# Patient Record
Sex: Female | Born: 1935 | Race: White | Hispanic: No | Marital: Married | Smoking: Former smoker
Health system: Southern US, Community
[De-identification: ages and names within clinical notes are randomized; demographics above are authoritative.]

## PROBLEM LIST (undated history)

## (undated) DIAGNOSIS — C189 Malignant neoplasm of colon, unspecified: Secondary | ICD-10-CM

## (undated) DIAGNOSIS — G500 Trigeminal neuralgia: Secondary | ICD-10-CM

## (undated) DIAGNOSIS — C801 Malignant (primary) neoplasm, unspecified: Secondary | ICD-10-CM

## (undated) DIAGNOSIS — I509 Heart failure, unspecified: Secondary | ICD-10-CM

## (undated) DIAGNOSIS — H8110 Benign paroxysmal vertigo, unspecified ear: Principal | ICD-10-CM

## (undated) DIAGNOSIS — C649 Malignant neoplasm of unspecified kidney, except renal pelvis: Secondary | ICD-10-CM

## (undated) DIAGNOSIS — M1990 Unspecified osteoarthritis, unspecified site: Secondary | ICD-10-CM

## (undated) DIAGNOSIS — E538 Deficiency of other specified B group vitamins: Secondary | ICD-10-CM

## (undated) DIAGNOSIS — F32A Depression, unspecified: Secondary | ICD-10-CM

## (undated) DIAGNOSIS — F419 Anxiety disorder, unspecified: Secondary | ICD-10-CM

## (undated) DIAGNOSIS — M810 Age-related osteoporosis without current pathological fracture: Secondary | ICD-10-CM

## (undated) DIAGNOSIS — J449 Chronic obstructive pulmonary disease, unspecified: Secondary | ICD-10-CM

## (undated) DIAGNOSIS — E785 Hyperlipidemia, unspecified: Secondary | ICD-10-CM

## (undated) DIAGNOSIS — F329 Major depressive disorder, single episode, unspecified: Secondary | ICD-10-CM

## (undated) HISTORY — DX: Benign paroxysmal vertigo, unspecified ear: H81.10

## (undated) HISTORY — DX: Heart failure, unspecified: I50.9

## (undated) HISTORY — DX: Trigeminal neuralgia: G50.0

## (undated) HISTORY — DX: Malignant neoplasm of unspecified kidney, except renal pelvis: C64.9

## (undated) HISTORY — DX: Anxiety disorder, unspecified: F41.9

## (undated) HISTORY — DX: Major depressive disorder, single episode, unspecified: F32.9

## (undated) HISTORY — DX: Depression, unspecified: F32.A

## (undated) HISTORY — DX: Chronic obstructive pulmonary disease, unspecified: J44.9

## (undated) HISTORY — DX: Unspecified osteoarthritis, unspecified site: M19.90

## (undated) HISTORY — DX: Malignant neoplasm of colon, unspecified: C18.9

---

## 1976-03-26 HISTORY — PX: CYST EXCISION: SHX5701

## 2002-03-26 HISTORY — PX: CHOLECYSTECTOMY: SHX55

## 2002-03-26 HISTORY — PX: HEMICOLECTOMY: SHX854

## 2003-03-27 HISTORY — PX: KIDNEY SURGERY: SHX687

## 2007-12-12 ENCOUNTER — Ambulatory Visit: Payer: Self-pay | Admitting: Cardiology

## 2011-08-10 ENCOUNTER — Encounter: Payer: Self-pay | Admitting: Hematology and Oncology

## 2011-08-10 DIAGNOSIS — C50919 Malignant neoplasm of unspecified site of unspecified female breast: Secondary | ICD-10-CM

## 2011-08-10 DIAGNOSIS — C649 Malignant neoplasm of unspecified kidney, except renal pelvis: Secondary | ICD-10-CM

## 2011-08-21 ENCOUNTER — Encounter: Payer: Medicare Other | Admitting: Hematology and Oncology

## 2011-08-24 DIAGNOSIS — R0602 Shortness of breath: Secondary | ICD-10-CM

## 2012-10-07 ENCOUNTER — Encounter: Payer: Self-pay | Admitting: Neurology

## 2012-10-07 ENCOUNTER — Ambulatory Visit (INDEPENDENT_AMBULATORY_CARE_PROVIDER_SITE_OTHER): Payer: Medicare Other | Admitting: Neurology

## 2012-10-07 VITALS — BP 136/73 | HR 91 | Temp 98.2°F | Ht 65.25 in | Wt 164.0 lb

## 2012-10-07 DIAGNOSIS — H8110 Benign paroxysmal vertigo, unspecified ear: Secondary | ICD-10-CM

## 2012-10-07 HISTORY — DX: Benign paroxysmal vertigo, unspecified ear: H81.10

## 2012-10-07 NOTE — Progress Notes (Signed)
Subjective:    Patient ID: Sharon Rogers is a 79 y.o. female.  HPI  Huston Foley, MD, PhD [facility] [address], [address] [address] [place], [place] [address]   Dear Tresa Endo,   I saw your patient, Sharon Rogers, upon your kind request in my neurologic clinic today for initial consultation of her vertigo. The patient is accompanied by her daughter today. As you know, [name] is a very pleasant 79 year old right-handed woman with an underlying medical history of colon cancer, kidney cancer, COPD, vitamin B12 deficiency, osteoporosis, hyperlipidemia, psoriatic arthritis, anxiety and depression, who has been feeling off balance off and on for years. She feels dizzy and has had some falls per daughter. Symptoms have become worse in the last 6-7 months. She has been on Tegretol for trigeminal neuralgia but has stopped taking it in the morning because of drowsiness. She feels that the room keep spinning when she is moving. This is worse with certain head positions. She is status post right hemicolectomy in 2004, cholecystectomy in 2004, left kidney removal in 2005 and has a remote history of back surgery in 1969. Her current medications are nortriptyline, Flonase, Proair, Spiriva, folic acid, carbamazepine, methotrexate, Prolia, visual, Zoloft, vitamin D, Singulair.  At the time of her visit with you on 08/26/2012 she was offered a referral to physical therapy but declined at the time. She sees Dr. Laurian Brim for pain management, in particular her TGN. She may be able to come off the Pamelor.   Her Past Medical History Is Significant For: Past Medical History  Diagnosis Date  . Benign paroxysmal positional vertigo 10/07/2012  . Colon cancer   . Kidney cancer, primary, with metastasis from kidney to other site   . Arthritis   . Trigeminal neuralgia   . Depression   . Anxiety     Her Past Surgical History Is Significant For: Past Surgical History  Procedure  Laterality Date  . Hemicolectomy Right 2004  . Cholecystectomy  2004  . Kidney surgery Left 2005    removed  . Cesarean section  [phone]  . Cyst excision  1978    Base of spine    Her Family History Is Significant For: Family History  Problem Relation Age of Onset  . Cancer Sister     Breast  . Cancer Sister     Breast  . Cancer Sister     Breast    Her Social History Is Significant For: History   Social History  . Marital Status: Married    Spouse Name: N/A    Number of Children: N/A  . Years of Education: N/A   Social History Main Topics  . Smoking status: Former Smoker    Types: Cigarettes    Quit date: 10/08/1979  . Smokeless tobacco: None  . Alcohol Use: None  . Drug Use: No  . Sexually Active: None   Other Topics Concern  . None   Social History Narrative  . None    Her Allergies Are:  Allergies  Allergen Reactions  . Penicillins Itching  . Phenergan (Promethazine Hcl) Other (See Comments)    Psychological   :   Her Current Medications Are:  Outpatient Encounter Prescriptions as of 10/07/2012  Medication Sig Dispense Refill  . carbamazepine (TEGRETOL) 200 MG tablet Take 2 tablets by mouth daily.      . ergocalciferol (VITAMIN D2) 50000 UNITS capsule Take 50,000 Units by mouth once a week.      . fish  oil-omega-3 fatty acids 1000 MG capsule Take 1,290 mg by mouth daily.      . Fluticasone-Salmeterol (ADVAIR) 250-50 MCG/DOSE AEPB Inhale 1 puff into the lungs every 12 (twelve) hours.      . folic acid (FOLVITE) 1 MG tablet Take 1 mg by mouth daily.      . methotrexate (RHEUMATREX) 2.5 MG tablet Take 5 tablets by mouth once a week.      . montelukast (SINGULAIR) 10 MG tablet Take 10 mg by mouth daily.      . nortriptyline (PAMELOR) 50 MG capsule Take 50 mg by mouth daily.      . sertraline (ZOLOFT) 100 MG tablet Take 100 mg by mouth daily.      . vitamin B-12 (CYANOCOBALAMIN) 1000 MCG tablet Take 1,000 mcg by mouth as needed.       No  facility-administered encounter medications on file as of 10/07/2012.  : Review of Systems  Constitutional: Positive for fatigue and unexpected weight change.  HENT: Positive for hearing loss, rhinorrhea and tinnitus.   Eyes: Positive for visual disturbance (blurred).  Respiratory:       Snoring  Cardiovascular: Positive for leg swelling.  Musculoskeletal: Positive for joint swelling and arthralgias.  Neurological: Positive for dizziness and weakness.       Memory list  Psychiatric/Behavioral: Positive for dysphoric mood. The patient is nervous/anxious.        Slepiness    Objective:  Neurologic Exam  Physical Exam Physical Examination:   Filed Vitals:   10/07/12 1344  BP: 136/73  Pulse: 91  Temp: 98.2 F (36.8 C)    General Examination: The patient is a very pleasant 79 y.o. female in no acute distress. She appears well-developed and well-nourished and well groomed.   HEENT: Normocephalic, atraumatic, pupils are equal, round and reactive to light and accommodation. Funduscopic exam is normal with sharp disc margins noted. Extraocular tracking is good without limitation to gaze excursion or nystagmus noted. Normal smooth pursuit is noted. Hearing is grossly intact. Tympanic membranes are clear bilaterally. Face is symmetric with normal facial animation and normal facial sensation. Speech is clear with no dysarthria noted. There is no hypophonia. There is no lip, neck/head, jaw or voice tremor. Neck is supple with full range of passive and active motion. There are no carotid bruits on auscultation. Oropharynx exam reveals: mild mouth dryness, adequate dental hygiene and mild airway crowding, due to narrow airway. Mallampati is class II. Tongue protrudes centrally and palate elevates symmetrically.   Chest: Clear to auscultation without wheezing, rhonchi or crackles noted.  Heart: S1+S2+0, regular and normal without murmurs, rubs or gallops noted.   Abdomen: Soft, non-tender and  non-distended with normal bowel sounds appreciated on auscultation.  Extremities: There is no pitting edema in the distal lower extremities bilaterally. Pedal pulses are intact.  Skin: Warm and dry without trophic changes noted. There are no varicose veins.  Musculoskeletal: exam reveals no obvious joint deformities, tenderness or joint swelling or erythema.   Neurologically:  Mental status: The patient is awake, alert and oriented in all 4 spheres. Her memory, attention, language and knowledge are appropriate. There is no aphasia, agnosia, apraxia or anomia. Speech is clear with normal prosody and enunciation. Thought process is linear. Mood is congruent and affect is normal.  Cranial nerves are as described above under HEENT exam. In addition, shoulder shrug is normal with equal shoulder height noted. Motor exam: Normal bulk, strength and tone is noted. There is no drift, tremor or  rebound. Romberg is negative. Reflexes are 2+ throughout. Toes are downgoing bilaterally. Fine motor skills are intact with normal finger taps, normal hand movements, normal rapid alternating patting, normal foot taps and normal foot agility.  Cerebellar testing shows no dysmetria or intention tremor on finger to nose testing. Heel to shin is unremarkable bilaterally. There is no truncal or gait ataxia.  Sensory exam is intact to light touch, pinprick, vibration, temperature sense and proprioception in the upper and lower extremities.  Gait, station and balance are unremarkable. No veering to one side is noted. No leaning to one side is noted. Posture is age-appropriate and stance is narrow based. No problems turning are noted. She turns in 3 steps. Tandem walk is unremarkable. Intact toe and heel stance is noted.               Assessment and Plan:   In summary, Alese Furniss is a very pleasant 79 y.o.-year old female with a history of colon cancer, kidney cancer, COPD, vitamin B12 deficiency, osteoporosis,  hyperlipidemia, psoriatic arthritis, anxiety and depression, who presents with a Hx of intermittent vertigo with falls reported. Her history and physical exam are most consistent with BPPV. Otherwise, her physical exam is non-focal and I reassured the patient in that regard.  I had a long chat with the patient and her daughter about my findings and the diagnosis of positional vertigo, the prognosis and treatment options. We talked about medical treatments and non-pharmacological approaches. We talked about staying well hydrated and changing position slowly, especially at night. I would like to see her come off of the Pamelor as it can contribute to sedation and balance issues. I explained to them, that there is no specific medication and that PT with vestibular rehab would help. They will contact your about doing PT locally.  As far as further diagnostic testing is concerned, I suggested the following today: MRI brain w and w/o Gad. I answered all their questions today and the patient and her daughter were in agreement with the above outlined plan. I would like to see the patient back in 3 months, sooner if the need arises and encouraged them to call with any interim questions, concerns, problems or updates and test results.   Thank you very much for allowing me to participate in the care of this nice patient. If I can be of any further assistance to you please do not hesitate to call me at [phone].  Sincerely,   Huston Foley, MD, PhD

## 2012-10-07 NOTE — Patient Instructions (Addendum)
I think overall you are doing fairly well but I do want to suggest a few things today:  Remember to drink plenty of fluid, eat healthy meals and do not skip any meals.   Change positions slowly, especially at night. Use your cane regularly.  As far as your medications are concerned, I would like to suggest no new medication, but I would like to see you come off of pamelor, if possible. Talk to your pain doctor tomorrow.   As far as diagnostic testing: MRI brain with and without contrast.   I would recommend physical therapy, talk to Quentin Mulling about doing this locally. I would like to see you back in 3 months, sooner if we need to. Please call us with any interim questions, concerns, problems, updates or refill requests.  Brett Canales is my clinical assistant and will answer any of your questions and relay your messages to me and also relay most of my messages to you.  Our phone number is [phone]. We also have an after hours call service for urgent matters and there is a physician on-call for urgent questions. For any emergencies you know to call 911 or go to the nearest emergency room.

## 2013-01-14 ENCOUNTER — Ambulatory Visit: Payer: Medicare Other | Admitting: Neurology

## 2013-04-23 ENCOUNTER — Ambulatory Visit (INDEPENDENT_AMBULATORY_CARE_PROVIDER_SITE_OTHER): Payer: Medicare Other | Admitting: Neurology

## 2013-04-23 ENCOUNTER — Encounter (INDEPENDENT_AMBULATORY_CARE_PROVIDER_SITE_OTHER): Payer: Self-pay

## 2013-04-23 ENCOUNTER — Encounter: Payer: Self-pay | Admitting: Neurology

## 2013-04-23 VITALS — BP 145/76 | HR 76 | Temp 98.4°F | Ht 65.25 in | Wt 161.0 lb

## 2013-04-23 DIAGNOSIS — H8110 Benign paroxysmal vertigo, unspecified ear: Secondary | ICD-10-CM

## 2013-04-23 NOTE — Patient Instructions (Signed)
Please remember, that vertigo can recur without warning. It can last hours or days. Please change positions slowly and always stay well-hydrated. While there is no specific medication that helps with vertigo, some people get relief with as needed use of meclizine. Certain medications can exacerbate vertigo.  Please drink more water! Use your cane for safety.

## 2013-04-23 NOTE — Progress Notes (Signed)
Subjective:    Patient ID: Sharon Rogers is a 80 y.o. female.  HPI    Interim history:   Sharon Rogers is a very pleasant 80 year old right-handed woman with an underlying medical history of colon cancer, kidney cancer, COPD, vitamin B12 deficiency, osteoporosis, hyperlipidemia, psoriatic arthritis, anxiety and depression, who presents for followup consultation of her vertigo. She is accompanied by her daughter again today. I first met her on 10/07/2012, and which time I felt she had BPPV. She had a nonfocal exam at the time. I asked her to stay well-hydrated, change positions slowly and suggested that she consider coming off of the Pamelor as it can contribute to sedation and balance issues. I suggested physical therapy for vestibular rehabilitation and she wanted to do this locally. I also ordered a brain MRI with and without contrast. She brain MRI with and without contrast at [facility] on 10/14/2012 and I reviewed the report on 10/15/2012: Impression: no acute infarct, prominent small vessel disease type changes, global atrophy, no intracranial mass or abnormal enhancement, mild paranasal sinus mucosal thickening most notable on the right.  At the time of her first visit she reported feeling off balance off and on for years. She had had some falls. Symptoms became worse in the last 6-7 months prior to her first visit. She had been on Tegretol for trigeminal neuralgia but stopped taking it in the morning because of drowsiness. She is status post right hemicolectomy in 2004, cholecystectomy in 2004, left kidney removal in 2005 and has a remote history of back surgery in 1969.  She sees Dr. Francesco Runner for pain management, in particular her TGN.  Today, she reports doing better with her vertigo, she has also had some changes in her medications: She is on a lower dose of Tegretol, came off of nortriptyline and came off of Zoloft and is now on Cymbalta twice daily. She thinks that this  has helped quite a bit with her pain. She feels improved. PT was done and somewhat helpful and finished a little earlier. She has no new complaints today.  Her Past Medical History Is Significant For: Past Medical History  Diagnosis Date  . Benign paroxysmal positional vertigo 10/07/2012  . Colon cancer   . Kidney cancer, primary, with metastasis from kidney to other site   . Arthritis   . Trigeminal neuralgia   . Depression   . Anxiety     Her Past Surgical History Is Significant For: Past Surgical History  Procedure Laterality Date  . Hemicolectomy Right 2004  . Cholecystectomy  2004  . Kidney surgery Left 2005    removed  . Cesarean section  [phone]  . Cyst excision  1978    Base of spine    Her Family History Is Significant For: Family History  Problem Relation Age of Onset  . Cancer Sister     Breast  . Cancer Sister     Breast  . Cancer Sister     Breast    Her Social History Is Significant For: History   Social History  . Marital Status: Married    Spouse Name: N/A    Number of Children: N/A  . Years of Education: N/A   Social History Main Topics  . Smoking status: Former Smoker    Types: Cigarettes    Quit date: 10/08/1979  . Smokeless tobacco: None  . Alcohol Use: None  . Drug Use: No  . Sexual Activity: None   Other Topics Concern  .  None   Social History Narrative  . None    Her Allergies Are:  Allergies  Allergen Reactions  . Penicillins Itching  . Phenergan [Promethazine Hcl] Other (See Comments)    Psychological     Her Current Medications Are:  Outpatient Encounter Prescriptions as of 04/23/2013  Medication Sig  . carbamazepine (TEGRETOL) 200 MG tablet Take 2 tablets by mouth daily.  . DULoxetine (CYMBALTA) 60 MG capsule Take 1 capsule by mouth at bedtime.  . ergocalciferol (VITAMIN D2) 50000 UNITS capsule Take 50,000 Units by mouth once a week.  . fish oil-omega-3 fatty acids 1000 MG capsule Take 1,290 mg by mouth  daily.  . Fluticasone-Salmeterol (ADVAIR) 250-50 MCG/DOSE AEPB Inhale 1 puff into the lungs every 12 (twelve) hours.  . folic acid (FOLVITE) 1 MG tablet Take 1 mg by mouth daily.  . methotrexate (RHEUMATREX) 2.5 MG tablet Take 6 tablets by mouth once a week.   . montelukast (SINGULAIR) 10 MG tablet Take 10 mg by mouth daily.  Marland Kitchen SPIRIVA HANDIHALER 18 MCG inhalation capsule Place 1 capsule into inhaler and inhale daily.  . [DISCONTINUED] sertraline (ZOLOFT) 100 MG tablet Take 100 mg by mouth daily.  . nortriptyline (PAMELOR) 50 MG capsule Take 50 mg by mouth daily.  . [DISCONTINUED] vitamin B-12 (CYANOCOBALAMIN) 1000 MCG tablet Take 1,000 mcg by mouth as needed.  :  Review of Systems:  Out of a complete 14 point review of systems, all are reviewed and negative with the exception of these symptoms as listed below:   Review of Systems  Constitutional: Negative.   HENT: Positive for nosebleeds, rhinorrhea and tinnitus.   Eyes: Positive for redness.  Respiratory: Negative.   Cardiovascular: Negative.   Gastrointestinal: Negative.   Endocrine: Positive for cold intolerance.  Genitourinary: Negative.   Musculoskeletal:       Cramps   Allergic/Immunologic: Negative.   Neurological: Positive for dizziness and headaches.  Hematological: Negative.   Psychiatric/Behavioral: Negative.     Objective:  Neurologic Exam  Physical Exam Physical Examination:   Filed Vitals:   04/23/13 1434  BP: 145/76  Pulse: 76  Temp: 98.4 F (36.9 C)    General Examination: The patient is a very pleasant 80 y.o. female in no acute distress. She appears well-developed and well-nourished and well groomed.   HEENT: Normocephalic, atraumatic, pupils are equal, round and reactive to light and accommodation. Funduscopic exam is normal with sharp disc margins noted. Extraocular tracking is good without limitation to gaze excursion or nystagmus noted. Normal smooth pursuit is noted. Hearing is grossly intact.  Face is symmetric with normal facial animation and normal facial sensation. Speech is clear with no dysarthria noted. There is no hypophonia. There is no lip, neck/head, jaw or voice tremor. Neck is supple with full range of passive and active motion. There are no carotid bruits on auscultation. Oropharynx exam reveals: mild mouth dryness, adequate dental hygiene and mild airway crowding, due to narrow airway. Mallampati is class II. Tongue protrudes centrally and palate elevates symmetrically.   Chest: Clear to auscultation without wheezing, rhonchi or crackles noted.  Heart: S1+S2+0, regular and normal without murmurs, rubs or gallops noted.   Abdomen: Soft, non-tender and non-distended with normal bowel sounds appreciated on auscultation.  Extremities: There is no pitting edema in the distal lower extremities bilaterally. Pedal pulses are intact. Some non-pitting puffiness in her R ankle.   Skin: Warm and dry without trophic changes noted. There are no varicose veins.  Musculoskeletal: exam reveals no obvious  joint deformities, tenderness or joint swelling or erythema.   Neurologically:  Mental status: The patient is awake, alert and oriented in all 4 spheres. Her memory, attention, language and knowledge are appropriate. There is no aphasia, agnosia, apraxia or anomia. Speech is clear with normal prosody and enunciation. Thought process is linear. Mood is congruent and affect is normal.  Cranial nerves are as described above under HEENT exam. In addition, shoulder shrug is normal with equal shoulder height noted. Motor exam: Normal bulk, strength and tone is noted. There is no drift, tremor or rebound. Romberg is negative. Reflexes are 2+ throughout. Toes are downgoing bilaterally. Fine motor skills are intact with normal finger taps, normal hand movements, normal rapid alternating patting, normal foot taps and normal foot agility.  Cerebellar testing shows no dysmetria or intention tremor on  finger to nose testing. Heel to shin is unremarkable bilaterally. There is no truncal or gait ataxia.  Sensory exam is intact to light touch, pinprick, vibration, temperature sense in the upper and lower extremities.  Gait, station and balance: she stands up without problems and her posture is age-appropriate and stance is narrow based. No problems turning are noted. She walks fairly well with her 4 pronged cane.                Assessment and Plan:   In summary, Sharon Rogers is a very pleasant 80 year old female with a history of colon cancer, kidney cancer, COPD, vitamin B12 deficiency, osteoporosis, hyperlipidemia, psoriatic arthritis, anxiety and depression, who presents for followup consultation of her history of vertigo with prior falls reported. Thankfully she has not had any recent falls. I believe she has benign positional vertigo. She had a brain MRI which we reviewed which showed white matter changes and global atrophy. There is no focal finding in no acute or old stroke. She went through physical therapy with improvement. She has a stable exam. She has improved in her symptoms and has had some medication changes which helps. I again discussed with the patient and her daughter the diagnosis of positional vertigo, the prognosis and treatment options. We talked about medical treatments and non-pharmacological approaches. We talked about staying well hydrated and changing position slowly, especially at night. I suggested she continue using her 4 pronged cane primarily for safety. I encouraged her to drink more water. I think she is stable enough for me to see her on an as-needed basis.  I answered all their questions today and the patient and her daughter were in agreement with the above outlined plan.

## 2015-04-27 ENCOUNTER — Encounter: Payer: Self-pay | Admitting: Neurology

## 2015-04-27 ENCOUNTER — Telehealth: Payer: Self-pay | Admitting: Neurology

## 2015-04-27 ENCOUNTER — Ambulatory Visit (INDEPENDENT_AMBULATORY_CARE_PROVIDER_SITE_OTHER): Payer: Medicare Other | Admitting: Neurology

## 2015-04-27 VITALS — BP 130/58 | HR 72 | Resp 16 | Ht 65.25 in | Wt 146.0 lb

## 2015-04-27 DIAGNOSIS — G500 Trigeminal neuralgia: Secondary | ICD-10-CM

## 2015-04-27 NOTE — Progress Notes (Signed)
Subjective:    Patient ID: Sharon Rogers is a 82 y.o. female.  HPI     Interim history:   Dear Dr. Scotty Court,   I saw your patient, Sharon Rogers, upon your kind request, in my neurologic clinic today for initial consultation of her trigeminal neuralgia. The patient is accompanied by her daughter today. I have previously seen the patient for vertigo. She has an underlying complex medical history of colon cancer, kidney cancer, status post nephrectomy, COPD, vitamin B12 deficiency, osteoporosis, hyperlipidemia, psoriatic arthritis, anxiety, depression, vertigo and trigeminal neuralgia, for which she had been seeing Dr. Francesco Runner. I last saw her on 04/23/2013, at which time she reported feeling improved with her respect to her vertigo. She had some changes in her medications and was on a lower dose of Tegretol, was off of nortriptyline and off of Zoloft. She was on Cymbalta twice daily. She had finished physical therapy and felt that it was helpful for her vertigo. She was advised to follow-up as needed.   Today, 04/27/2015: She reports for her trigeminal neuralgia, she has been on Tegretol gabapentin, Lyrica, and as needed hydrocodone. I reviewed your office note from 02/03/2015, which you kindly included. She had a fall in October 2016 as she got up from the seated position and felt that her right leg gave out and she fell striking her head on a small table. She also bruised her arm and right chest. She was started on sertraline. She was advised to use her walker at all times. She was advised regarding fall risk. She no longer sees Dr. Francesco Runner as she did not want to drive that far. She no longer drives. She was restarted on sertraline, which is currently 100 mg once daily. She has had issues with nausea and throwing up and not being able to keep the tegretol down. She feels that the tegretol was helping. Lyrica did not help. She was seeing NP, Ms. San Jetty, at [facility], who  was prescribing the Lyrica but the patient feels it is no longer helping. She also has hydrocodone, 5-325 milligrams strength as needed. She takes it infrequently. Symptoms started in 2008 on the right side. She has right midface pain and numbness. She has daily attacks. She has sometimes difficulty chewing and eating because of this. She has skipped meals because of this. She does not like to drink boost or ensure. She has not always maintained her hydration. She only has one kidney.  Of note, she went to the emergency room at [facility] on 04/24/2015. She was diagnosed with dehydration and urinary tract infection. She was prescribed Omnicef for 5 days and Zofran for nausea. I reviewed the hospital records she brought in today. Urinalysis showed cloudy appearance urine nitrite positive, leukocyte esterase moderately high, CMP showed BUN of 21, creatinine 1.15, otherwise fairly unremarkable findings.  Previously:  I first met her on 10/07/2012, and which time I felt she had BPPV. She had a nonfocal exam at the time. I asked her to stay well-hydrated, change positions slowly and suggested that she consider coming off of the Pamelor as it can contribute to sedation and balance issues. I suggested physical therapy for vestibular rehabilitation and she wanted to do this locally. I also ordered a brain MRI with and without contrast. She brain MRI with and without contrast at [facility] on 10/14/2012 and I reviewed the report on 10/15/2012: Impression: no acute infarct, prominent small vessel disease type changes, global atrophy, no intracranial mass  or abnormal enhancement, mild paranasal sinus mucosal thickening most notable on the right.   At the time of her first visit she reported feeling off balance off and on for years. She had had some falls. Symptoms became worse in the last 6-7 months prior to her first visit. She had been on Tegretol for trigeminal neuralgia but stopped  taking it in the morning because of drowsiness. She is status post right hemicolectomy in 2004, cholecystectomy in 2004, left kidney removal in 2005 and has a remote history of back surgery in 1969.   She sees Dr. Francesco Runner for pain management, in particular her TGN.    Her Past Medical History Is Significant For: Past Medical History  Diagnosis Date  . Benign paroxysmal positional vertigo 10/07/2012  . Colon cancer ([place])   . Kidney cancer, primary, with metastasis from kidney to other site [facility])   . Arthritis   . Trigeminal neuralgia   . Depression   . Anxiety   . Trigeminal neuralgia   . COPD (chronic obstructive pulmonary disease) ([place])   . BPPV (benign paroxysmal positional vertigo)     Her Past Surgical History Is Significant For: Past Surgical History  Procedure Laterality Date  . Hemicolectomy Right 2004  . Cholecystectomy  2004  . Kidney surgery Left 2005    removed  . Cesarean section  [phone]  . Cyst excision  1978    Base of spine    Her Family History Is Significant For: Family History  Problem Relation Age of Onset  . Cancer Sister     Breast  . Cancer Sister     Breast  . Cancer Sister     Breast  . Stroke Father     Her Social History Is Significant For: Social History   Social History  . Marital Status: Married    Spouse Name: N/A  . Number of Children: 3  . Years of Education: HS   Occupational History  . Retired    Social History Main Topics  . Smoking status: Former Smoker    Types: Cigarettes    Quit date: 10/08/1979  . Smokeless tobacco: None  . Alcohol Use: No  . Drug Use: No  . Sexual Activity: Not Asked   Other Topics Concern  . None   Social History Narrative   Patient drinks 1 coke a day     Her Allergies Are:  Allergies  Allergen Reactions  . Penicillins Itching  . Phenergan [Promethazine Hcl] Other (See Comments)    Psychological   . Sulfa Antibiotics   . Tramadol   :   Her Current Medications Are:   Outpatient Encounter Prescriptions as of 04/27/2015  Medication Sig  . carbamazepine (TEGRETOL) 200 MG tablet 200 mg 2 times daily at 12 noon and 4 pm.   . cefdinir (OMNICEF) 250 MG/5ML suspension Take by mouth 2 (two) times daily.  . folic acid (FOLVITE) 1 MG tablet Take 1 mg by mouth daily.  Marland Kitchen HYDROcodone-acetaminophen (NORCO/VICODIN) 5-325 MG tablet Take 1 tablet by mouth every 4 (four) hours as needed for moderate pain.  Marland Kitchen ondansetron (ZOFRAN) 4 MG tablet TK 1 T PO Q 8 H PRF NAUSEA  . Probiotic Product ([place]) Take by mouth.  . ranitidine (ZANTAC) 150 MG capsule Take 150 mg by mouth 2 (two) times daily.  . sertraline (ZOLOFT) 100 MG tablet Take 100 mg by mouth daily.  . [DISCONTINUED] albuterol (PROVENTIL HFA;VENTOLIN HFA) 108 (90 Base) MCG/ACT inhaler Inhale into  the lungs every 6 (six) hours as needed for wheezing or shortness of breath.  . [DISCONTINUED] aspirin 325 MG tablet Take 325 mg by mouth daily.  . [DISCONTINUED] Cholecalciferol (VITAMIN D) 2000 units CAPS Take by mouth.  . [DISCONTINUED] fish oil-omega-3 fatty acids 1000 MG capsule Take 1,290 mg by mouth daily.  . [DISCONTINUED] fluticasone (FLOVENT DISKUS) 50 MCG/BLIST diskus inhaler Inhale 1 puff into the lungs 2 (two) times daily.  . [DISCONTINUED] Fluticasone-Salmeterol (ADVAIR) 250-50 MCG/DOSE AEPB Inhale 1 puff into the lungs every 12 (twelve) hours.  . [DISCONTINUED] methotrexate (RHEUMATREX) 2.5 MG tablet Take 6 tablets by mouth once a week.   . [DISCONTINUED] oxyCODONE-acetaminophen (PERCOCET/ROXICET) 5-325 MG tablet TK 1 T PO Q 4 H PRN P  . [DISCONTINUED] pregabalin (LYRICA) 100 MG capsule Take 100 mg by mouth 2 (two) times daily.  . [DISCONTINUED] Umeclidinium-Vilanterol (ANORO ELLIPTA) 62.5-25 MCG/INH AEPB Inhale into the lungs.   No facility-administered encounter medications on file as of 04/27/2015.  :  Review of Systems:  Out of a complete 14 point review of systems, all are reviewed and  negative with the exception of these symptoms as listed below:  Review of Systems  Constitutional: Positive for fatigue.  HENT: Positive for rhinorrhea.   Respiratory: Positive for shortness of breath.   Neurological:       Patient went to ED on 04/24/15. Daughter reports that she had been nauseous and was unable to keep her medications down for several days. Therefore, she had facial pain.   Hematological: Bruises/bleeds easily.  Psychiatric/Behavioral:       Decreased energy     Objective:  Neurologic Exam  Physical Exam Physical Examination:   Filed Vitals:   04/27/15 1311  BP: 130/58  Pulse: 72  Resp: 16   General Examination: The patient is a very pleasant 82y.o. female in no acute distress. She appears well-developed and well-nourished and well groomed.   HEENT: Normocephalic, atraumatic, pupils are equal, round and reactive to light and accommodation. Funduscopic exam is normal with sharp disc margins noted. She has bilateral cataracts. Extraocular tracking is good without limitation to gaze excursion or nystagmus noted. Normal smooth pursuit is noted. Hearing is grossly intact. Face is symmetric with normal facial animation and normal facial sensation on the left but she does have sensitivity to touch and temperature in the right mid face. She has currently no significant painful attacks. Speech is clear with no dysarthria noted. There is no hypophonia. There is no lip, neck/head, jaw or voice tremor. Neck is supple with full range of passive and active motion. There are no carotid bruits on auscultation. Oropharynx exam reveals: mild mouth dryness, adequate dental hygiene and mild airway crowding, due to narrow airway. Mallampati is class II. Tongue protrudes centrally and palate elevates symmetrically.   Chest: Clear to auscultation without wheezing, rhonchi or crackles noted.  Heart: S1+S2+0, regular and normal without murmurs, rubs or gallops noted.   Abdomen: Soft,  non-tender and non-distended with normal bowel sounds appreciated on auscultation.  Extremities: There is no pitting edema in the distal lower extremities bilaterally. Pedal pulses are intact. Some non-pitting puffiness in her R ankle.   Skin: Warm and dry without trophic changes noted. There are no varicose veins.  Musculoskeletal: exam reveals no obvious joint deformities, tenderness or joint swelling or erythema.   Neurologically:  Mental status: The patient is awake, alert and oriented in all 4 spheres. Her memory, attention, language and knowledge are appropriate. There is no aphasia, agnosia,  apraxia or anomia. Speech is clear with normal prosody and enunciation. Thought process is linear. Mood is congruent and affect is normal.  Cranial nerves are as described above under HEENT exam. In addition, shoulder shrug is normal with equal shoulder height noted. Motor exam: Normal bulk, strength and tone is noted. There is no drift, tremor or rebound. Romberg shows sway. Reflexes are 1+ throughout. Toes are downgoing bilaterally. Fine motor skills are intact with normal finger taps, normal hand movements, normal rapid alternating patting, normal foot taps and normal foot agility.  Cerebellar testing shows no dysmetria or intention tremor on finger to nose testing.   Sensory exam is intact to light touch, pinprick, vibration, temperature sense in the upper and lower extremities.  Gait, station and balance: she stands up with mild insecurity. She stands slightly wide-based. Posture is age-appropriate. She walks slightly insecurely without her cane, better with her 4 pronged cane. Tandem walk is not possible.                 Assessment and Plan:   In summary, Sayra Frisby is a very pleasant 82 year old female with an underlying complex medical history of colon cancer, kidney cancer, status post nephrectomy, COPD, vitamin B12 deficiency, osteoporosis, hyperlipidemia, psoriatic arthritis,  anxiety, depression, vertigo, who presents for initial consultation of her right-sided trigeminal neuralgia, of several years duration, starting with symptoms in 2008. She has tried multiple medications but had the best results with Tegretol 200 mg twice daily. Unfortunately, she has had issues with nausea, particularly exacerbated by having an empty stomach. Her facial pain exacerbate sometimes when she eats, which leads to a perpetual vicious cycle. Nevertheless, I have asked her to make sure she stays well hydrated as she may not always drink enough water and try protein shakes with a straw to see if she can tolerate this better. In addition, we will try to switch to Tegretol-XR 200 mg twice daily in lieu of regular Tegretol. This may hold her better in case she does skip a dose because of nausea. Otherwise, her neurological exam is nonfocal. We did talk about surgical consultation from neurosurgery in the future. She has tried multiple medications and our options are limited at this time. Alternatively, we can also try her on oxcarbazepine. I suggested we proceed with a brain MRA without contrast. She would like to get this done at [facility]. I have placed an order for this. We will be in touch with regards to the test results as soon as I get of report. I will see her back routinely in about 3 months, sooner if needed. I answered all their questions today and the patient and her daughter were in agreement.  I spent 30 minutes in total face-to-face time with the patient, more than 50% of which was spent in counseling and coordination of care, reviewing test results, reviewing medication and discussing or reviewing the diagnosis of TGN, its prognosis and treatment options.

## 2015-04-27 NOTE — Telephone Encounter (Signed)
Patient is scheduled to have MRI BRAIN W/WO @ [facility] on Tuesday 2/7 @ 1:30PM. Sharon Rogers is requesting creatinine lab order for her to have before her MRI. Please advise once the lab order is placed so I can fax it along with MRI order. Thanks!

## 2015-04-27 NOTE — Patient Instructions (Addendum)
You may not drink enough water. Please do not skip meals. Try protein milkshakes, such as from [employer], or [employer].  We will try long acting Tegretol, called Tegretol XR 200 mg. Take one table two times a day. This is in lieu of the regular tegretol. You have tried multiple medications for the trigeminal neuralgia and unfortunately there are not a whole lot of options.  We will do a brain scan, called MRA and call you with the test results. We will have to schedule you for this on a separate date. This test requires authorization from your insurance, and we will take care of the insurance process. We may consider a neurosurgical consultation.

## 2015-04-28 ENCOUNTER — Telehealth: Payer: Self-pay | Admitting: Neurology

## 2015-04-28 DIAGNOSIS — G500 Trigeminal neuralgia: Secondary | ICD-10-CM

## 2015-04-28 MED ORDER — CARBAMAZEPINE ER 200 MG PO TB12: 200.0000 mg | ORAL_TABLET | Freq: Two times a day (BID) | ORAL | Status: DC

## 2015-04-28 NOTE — Telephone Encounter (Signed)
Daughter Nira Conn [phone] called to check status of Tegretol Rx that was to be called in to [facility], [place]

## 2015-04-28 NOTE — Telephone Encounter (Signed)
Nira Conn is aware that we have sent in Rx, confirmed pharmacy

## 2015-05-02 ENCOUNTER — Other Ambulatory Visit: Payer: Self-pay

## 2015-05-02 ENCOUNTER — Telehealth: Payer: Self-pay | Admitting: Neurology

## 2015-05-02 DIAGNOSIS — G500 Trigeminal neuralgia: Secondary | ICD-10-CM

## 2015-05-02 NOTE — Telephone Encounter (Signed)
Nira Conn, pt's daughter called in regards to [place] that was discussed on pt's last office visit on 04/27/15. There is no current order for MRA HEAD WO for the patient. Patient can be seen today @ [facility] in [place] by 3:30PM. I advised Nira Conn that I would be in contact with her once the order was placed in. Nira Conn can be reached @ [phone]

## 2015-05-02 NOTE — Telephone Encounter (Signed)
Not sure what's going on, ordered MRA head, order in chart. pls fax order.

## 2015-06-27 ENCOUNTER — Telehealth: Payer: Self-pay | Admitting: Neurology

## 2015-06-27 DIAGNOSIS — G500 Trigeminal neuralgia: Secondary | ICD-10-CM

## 2015-06-27 NOTE — Telephone Encounter (Signed)
Please call and try to talk to patient directly. Please verify that she is taking Tegretol-XR twice daily at this time. Please also find out how much hydrocodone she is taking if any on a day-to-day basis. If she still has frequent episodic facial pain, we will probably switch her to Trileptal. Please let me know after talking to patient. I will change her prescription accordingly.

## 2015-06-27 NOTE — Telephone Encounter (Signed)
Phone call from [facility], patient has appointment on 07/27/15, states mother is having a lot of pain with trigeminal neuralgia, wonders if carbamazepine (TEGRETOL XR) 200 MG 12 hr tablet could be increased.

## 2015-06-27 NOTE — Telephone Encounter (Signed)
I tried calling to confirm that she is taking twice a day, but just got vm. How would you like to proceed?

## 2015-06-28 ENCOUNTER — Telehealth: Payer: Self-pay | Admitting: Neurology

## 2015-06-28 MED ORDER — OXCARBAZEPINE 150 MG PO TABS: 150.0000 mg | ORAL_TABLET | Freq: Two times a day (BID) | ORAL | Status: DC

## 2015-06-28 NOTE — Telephone Encounter (Signed)
I spoke to daughter and she voiced understanding on new recommendations. She has 1 more question: is there any kind of pain medication that you can recommend the patient take? Daughter reports that she does not like the patient taking narcotics and mentioned that there are family members that may "take advantage" of her narcotics.

## 2015-06-28 NOTE — Telephone Encounter (Signed)
Please advise patient to reduce Tegretol XR to once daily for 1 week then stop. She can start Trileptal 150 mg strength one pill once daily for 1 week then 1 pill twice daily thereafter. Please have her call for update in 2 or 3 week. We may have room to increase the Trileptal after that.

## 2015-06-28 NOTE — Telephone Encounter (Signed)
I spoke to daughter and she is aware of recommendation below.

## 2015-06-28 NOTE — Telephone Encounter (Signed)
Error

## 2015-06-28 NOTE — Telephone Encounter (Signed)
Would not recommend any narcotics for several reasons: side effects, not good for chronic pain, habit formation. Can try tylenol, but with caution, avoid taking any pain medication, even OTC daily. pls call her back.

## 2015-06-28 NOTE — Telephone Encounter (Signed)
I spoke to daughter. Reports that her pain is not being touched by Tegretol not hydrocodone. She is taking Tegretol XR twice a day, she was taking hydrocodone 1/2 tab twice a day but now is taking 1 tab up to 3 times a day. Daughter reports that the hydrocodone does not help, only makes her sleep. She has not eaten for 3 days. Daughter is agreeable to any medications changes, needs directions for change. Uses [facility] on file, [address] ([address]

## 2015-07-27 ENCOUNTER — Encounter: Payer: Self-pay | Admitting: Neurology

## 2015-07-27 ENCOUNTER — Ambulatory Visit (INDEPENDENT_AMBULATORY_CARE_PROVIDER_SITE_OTHER): Payer: Medicare Other | Admitting: Neurology

## 2015-07-27 VITALS — BP 156/70 | HR 70 | Resp 16 | Ht 65.25 in | Wt 144.0 lb

## 2015-07-27 DIAGNOSIS — Z9181 History of falling: Secondary | ICD-10-CM

## 2015-07-27 DIAGNOSIS — G500 Trigeminal neuralgia: Secondary | ICD-10-CM

## 2015-07-27 MED ORDER — OXCARBAZEPINE 300 MG PO TABS: 300.0000 mg | ORAL_TABLET | Freq: Two times a day (BID) | ORAL | Status: DC

## 2015-07-27 NOTE — Patient Instructions (Addendum)
For your foot and leg swelling, please see your family doctor.  We will check blood work today and call you with the test results. We will request a neurosurgical opinion.  We will increase your Trileptal to 300 mg twice daily.  Stay well hydrated with water and eat healthy food.  Use your cane for gait safety, declutter the house, avoid rugs, and hold on to rails on stairs.

## 2015-07-27 NOTE — Progress Notes (Signed)
Subjective:    Patient ID: Sharon Rogers is a 82 y.o. female.  HPI     Interim history:  Sharon Rogers is a very pleasant 82 year old right-handed woman with an underlying complex medical history of colon cancer, kidney cancer, status post nephrectomy, COPD, vitamin B12 deficiency, osteoporosis, hyperlipidemia, psoriatic arthritis, anxiety, depression, vertigo and trigeminal neuralgia, who presents for follow-up consultation of her trigeminal neuralgia. The patient is accompanied by her daughter again today. I last saw her on 04/27/2015, at which time she reported a long-standing history of trigeminal neuralgia, for which she was followed by Dr. Francesco Runner for years, up until recently. She had tried multiple medications in the past including Tegretol immediate release, gabapentin, Lyrica, hydrocodone. She had a fall in October 2016, striking her head on the small table. She sustained bruises to her arm and right-sided chest. She was on sertraline for her mood disorder. She had stopped driving. Her sertraline was 100 mg once daily. She had issues with tolerance of Tegretol including nausea and vomiting. Lyrica did not help. She had also seen a nurse practitioner, Ms. San Jetty, at [facility], who had prescribed the Lyrica. She was using hydrocodone 5-325 mg as needed. Her symptoms date back to 2008 she started having right-sided neuralgic pain and numbness in the right midface. She reported daily attacks triggered by chewing or eating. She would have to skip meals because of fear of exacerbation of her pain. She also was not always drinking enough water. Of note, she only has one kidney. I suggested we switch her to Tegretol-XR twice daily. In the interim, in early April 2017 her daughter called back with flareup of the pain which was not responding to the long-acting Tegretol or hydrocodone. I suggested we tapered her off of the Tegretol-XR and started titration for Trileptal.    Today, 07/27/2015: She reports that the Trileptal has helped marginally. She is taking 150 mg twice daily at this time. She is tolerating it. She does not drink water very much. She tries to eat better. She has noticed some foot and ankle swelling bilaterally for the past several days, maybe about 9 days. Of note, she has not been on hydrocodone any longer and is alternating Tylenol and Advil. She only has one kidney. She was told in the past not to take any Aleve on a daily basis.  Of note, she fell a few days ago. She was squatting down and toppled backwards and bumped her head. She did not lose consciousness, some head pain but nothing sustained. She told her daughter a few days later. She does not use her cane inside the house because there is too many things to navigate. She had an MRA head without contrast at [facility] on 05/03/2015. I reviewed the images on CD. 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 It is not seem to be any significant abnormality.  I received the brain MRA report from [facility] as well. Impression: Ectatic vertebral arteries and basilar artery do not cause impression upon the cisternal aspect of the fifth cranial nerve. Right superior cerebellar artery approaches the cisternal aspect of the right fifth cranial nerve.    Previously:  I saw her on 04/23/2013, at which time she reported feeling improved with her respect to her vertigo. She had some changes in her medications and was on a lower dose of Tegretol, was off of nortriptyline and off of Zoloft. She was on Cymbalta twice daily. She had finished physical therapy and  felt that it was helpful for her vertigo. She was advised to follow-up as needed.   Of note, she went to the emergency room at [facility] on 04/24/2015. She was diagnosed with dehydration and urinary tract infection. She was prescribed Omnicef for 5 days and Zofran for nausea. I reviewed the hospital records she  brought in today. Urinalysis showed cloudy appearance urine nitrite positive, leukocyte esterase moderately high, CMP showed BUN of 21, creatinine 1.15, otherwise fairly unremarkable findings.  I first met her on 10/07/2012, and which time I felt she had BPPV. She had a nonfocal exam at the time. I asked her to stay well-hydrated, change positions slowly and suggested that she consider coming off of the Pamelor as it can contribute to sedation and balance issues. I suggested physical therapy for vestibular rehabilitation and she wanted to do this locally. I also ordered a brain MRI with and without contrast. She brain MRI with and without contrast at [facility] on 10/14/2012 and I reviewed the report on 10/15/2012: Impression: no acute infarct, prominent small vessel disease type changes, global atrophy, no intracranial mass or abnormal enhancement, mild paranasal sinus mucosal thickening most notable on the right.   At the time of her first visit she reported feeling off balance off and on for years. She had had some falls. Symptoms became worse in the last 6-7 months prior to her first visit. She had been on Tegretol for trigeminal neuralgia but stopped taking it in the morning because of drowsiness. She is status post right hemicolectomy in 2004, cholecystectomy in 2004, left kidney removal in 2005 and has a remote history of back surgery in 1969.   She sees Dr. Francesco Runner for pain management, in particular her TGN.    Her Past Medical History Is Significant For: Past Medical History  Diagnosis Date  . Benign paroxysmal positional vertigo 10/07/2012  . Colon cancer ([place])   . Kidney cancer, primary, with metastasis from kidney to other site [facility])   . Arthritis   . Trigeminal neuralgia   . Depression   . Anxiety   . Trigeminal neuralgia   . COPD (chronic obstructive pulmonary disease) ([place])   . BPPV (benign paroxysmal positional vertigo)     Her Past Surgical History Is Significant  For: Past Surgical History  Procedure Laterality Date  . Hemicolectomy Right 2004  . Cholecystectomy  2004  . Kidney surgery Left 2005    removed  . Cesarean section  [phone]  . Cyst excision  1978    Base of spine    Her Family History Is Significant For: Family History  Problem Relation Age of Onset  . Cancer Sister     Breast  . Cancer Sister     Breast  . Cancer Sister     Breast  . Stroke Father     Her Social History Is Significant For: Social History   Social History  . Marital Status: Married    Spouse Name: N/A  . Number of Children: 3  . Years of Education: HS   Occupational History  . Retired    Social History Main Topics  . Smoking status: Former Smoker    Types: Cigarettes    Quit date: 10/08/1979  . Smokeless tobacco: None  . Alcohol Use: No  . Drug Use: No  . Sexual Activity: Not Asked   Other Topics Concern  . None   Social History Narrative   Patient drinks 1 coke a day  Her Allergies Are:  Allergies  Allergen Reactions  . Penicillins Itching  . Phenergan [Promethazine Hcl] Other (See Comments)    Psychological   . Sulfa Antibiotics   . Tramadol   :   Her Current Medications Are:  Outpatient Encounter Prescriptions as of 07/27/2015  Medication Sig  . acetaminophen (TYLENOL) 500 MG tablet Take 500 mg by mouth every Thursday.  . folic acid (FOLVITE) 1 MG tablet Take 1 mg by mouth daily.  Marland Kitchen ibuprofen (ADVIL,MOTRIN) 200 MG tablet Take 200 mg by mouth as needed.  . methotrexate (RHEUMATREX) 2.5 MG tablet   . ondansetron (ZOFRAN) 4 MG tablet TK 1 T PO Q 8 H PRF NAUSEA  . OXcarbazepine (TRILEPTAL) 300 MG tablet Take 1 tablet (300 mg total) by mouth 2 (two) times daily.  . Probiotic Product ([place]) Take by mouth.  . ranitidine (ZANTAC) 150 MG capsule Take 150 mg by mouth 2 (two) times daily.  . sertraline (ZOLOFT) 100 MG tablet Take 100 mg by mouth daily.  . [DISCONTINUED] OXcarbazepine (TRILEPTAL) 150 MG  tablet Take 1 tablet (150 mg total) by mouth 2 (two) times daily.  . [DISCONTINUED] carbamazepine (TEGRETOL XR) 200 MG 12 hr tablet Take 1 tablet (200 mg total) by mouth 2 (two) times daily.  . [DISCONTINUED] cefdinir (OMNICEF) 250 MG/5ML suspension Take by mouth 2 (two) times daily.  . [DISCONTINUED] HYDROcodone-acetaminophen (NORCO/VICODIN) 5-325 MG tablet Take 1 tablet by mouth every 4 (four) hours as needed for moderate pain.   No facility-administered encounter medications on file as of 07/27/2015.  :  Review of Systems:  Out of a complete 14 point review of systems, all are reviewed and negative with the exception of these symptoms as listed below:   Review of Systems  Neurological:       Patient reports that the oxcarbazepine is helping her pain better but does not feel like she is where she should be.  Daughter reports feet and ankle swelling since starting oxcarbazepine.     Objective:  Neurologic Exam  Physical Exam Physical Examination:   Filed Vitals:   07/27/15 1008  BP: 156/70  Pulse: 70  Resp: 16   General Examination: The patient is a very pleasant 82 y.o. female in no acute distress. She appears well-developed and well-nourished and well groomed.   HEENT: Normocephalic, atraumatic, pupils are equal, round and reactive to light and accommodation. She has bilateral cataracts. Extraocular tracking is good without limitation to gaze excursion or nystagmus noted. Normal smooth pursuit is noted. Hearing is grossly intact. Face is symmetric with normal facial animation and normal facial sensation on the left but she does have sensitivity to touch and temperature in the right mid face. She has currently no significant painful attacks. Speech is clear with no dysarthria noted. There is no hypophonia. There is no lip, neck/head, jaw or voice tremor. Neck is supple with full range of passive and active motion. There are no carotid bruits on auscultation. Oropharynx exam reveals:  moderate mouth dryness, adequate dental hygiene and mild airway crowding, due to narrow airway. Mallampati is class II. Tongue protrudes centrally and palate elevates symmetrically.   Chest: Clear to auscultation without wheezing, rhonchi or crackles noted.  Heart: S1+S2+0, regular and normal without murmurs, rubs or gallops noted.   Abdomen: Soft, non-tender and non-distended with normal bowel sounds appreciated on auscultation.  Extremities: There is 1-2+ pitting edema in the distal lower extremities bilaterally. Pedal pulses are intact.    Skin: Warm  and dry without trophic changes noted. There are no varicose veins.  Musculoskeletal: exam reveals no obvious joint deformities, tenderness or joint swelling or erythema.   Neurologically:  Mental status: The patient is awake, alert and oriented in all 4 spheres. Her memory, attention, language and knowledge are appropriate. There is no aphasia, agnosia, apraxia or anomia. Speech is clear with normal prosody and enunciation. Thought process is linear. Mood is congruent and affect is normal.  Cranial nerves are as described above under HEENT exam. In addition, shoulder shrug is normal with equal shoulder height noted. Motor exam: Normal bulk, strength and tone is noted. There is no drift, tremor or rebound. Romberg shows sway. Reflexes are 1+ throughout. Fine motor skills are intact with normal finger taps, normal hand movements, normal rapid alternating patting, normal foot taps and normal foot agility.  Cerebellar testing shows no dysmetria or intention tremor on finger to nose testing.   Sensory exam is intact to light touch, pinprick, vibration, temperature sense in the upper and lower extremities.  Gait, station and balance: she stands up with mild insecurity. She stands slightly wide-based. Posture is age-appropriate. She walks slightly insecurely without her cane, better with her 4 pronged cane. Tandem walk is not possible for her.                  Assessment and Plan:   In summary, Sharon Rogers is a very pleasant 82 year old female with an underlying complex medical history of colon cancer, kidney cancer, status post nephrectomy, COPD, vitamin B12 deficiency, osteoporosis, hyperlipidemia, psoriatic arthritis, anxiety, depression, vertigo, who presents for initial consultation of her right-sided trigeminal neuralgia, of several years duration, with symptoms dating back to 2008. She has tried multiple medications but had the best results with Tegretol 200 mg twice daily. Unfortunately, she has had issues with nausea, particularly exacerbated by having an empty stomach. Her facial pain exacerbate sometimes when she eats, which leads to a perpetual vicious cycle. Nevertheless, we tried switching it to Tegretol-XR 200 mg twice daily, but she had residual pain. She has been off of hydrocodone and has been alternating Tylenol and Advil. Because of her single kidney, she has to be extra cautious with nonsteroidals. She is advised not to take nonsteroidals every day. We will check blood work, she is advised to follow-up with primary care physician especially regarding her lower leg swelling. We will call her with her blood test results. Today I suggested that we increase Trileptal, we started this a month or so ago. She has been on 150 mg twice daily and has tolerated it and it has made a difference. We will increase this to 300 mg twice daily. I provided a new prescription for this. She had an MRA head without contrast on 05/03/2015. I reviewed the report and that test images. She is advised that we could seek consultation with neurosurgery regarding this. She is likely not a surgical candidate, nevertheless, we can certainly request surgical input. The patient is agreeable. I made a referral in that regard. She is advised regarding gait safety and falls. She is advised to use her cane at all times. She is advised to de-clutter her house and make  sure there are no obstacles and avoid rugs. She is advised to use the handrail on stairs. She is furthermore advised to stay better hydrated with water. I will see her back in a few months, sooner if needed. I answered all their questions today and the patient and her  daughter were in agreement with the above plan. I spent 30 minutes in total face-to-face time with the patient, more than 50% of which was spent in counseling and coordination of care, reviewing test results, reviewing medication and discussing or reviewing the diagnosis of TGN, its prognosis and treatment options.

## 2015-07-28 ENCOUNTER — Telehealth: Payer: Self-pay

## 2015-07-28 LAB — COMPREHENSIVE METABOLIC PANEL
ALK PHOS: 77 IU/L (ref 39–117)
ALT: 5 IU/L (ref 0–32)
AST: 10 IU/L (ref 0–40)
Albumin/Globulin Ratio: 2.4 — ABNORMAL HIGH (ref 1.2–2.2)
Albumin: 4.3 g/dL (ref 3.5–4.7)
BUN/Creatinine Ratio: 13 (ref 12–28)
BUN: 11 mg/dL (ref 8–27)
Bilirubin Total: 0.3 mg/dL (ref 0.0–1.2)
CALCIUM: 9.2 mg/dL (ref 8.7–10.3)
CO2: 27 mmol/L (ref 18–29)
CREATININE: 0.86 mg/dL (ref 0.57–1.00)
Chloride: 103 mmol/L (ref 96–106)
GFR calc Af Amer: 72 mL/min/{1.73_m2} (ref >59)
GFR calc non Af Amer: 63 mL/min/{1.73_m2} (ref >59)
GLOBULIN, TOTAL: 1.8 g/dL (ref 1.5–4.5)
GLUCOSE: 96 mg/dL (ref 65–99)
Potassium: 4.4 mmol/L (ref 3.5–5.2)
SODIUM: 143 mmol/L (ref 134–144)
TOTAL PROTEIN: 6.1 g/dL (ref 6.0–8.5)

## 2015-07-28 LAB — CBC WITH DIFFERENTIAL/PLATELET
Basophils Absolute: 0 10*3/uL (ref 0.0–0.2)
Basos: 0 %
EOS (ABSOLUTE): 0.2 10*3/uL (ref 0.0–0.4)
EOS: 3 %
HEMATOCRIT: 40.5 % (ref 34.0–46.6)
Hemoglobin: 12.7 g/dL (ref 11.1–15.9)
IMMATURE GRANULOCYTES: 0 %
Immature Grans (Abs): 0 10*3/uL (ref 0.0–0.1)
Lymphocytes Absolute: 1.1 10*3/uL (ref 0.7–3.1)
Lymphs: 21 %
MCH: 31.5 pg (ref 26.6–33.0)
MCHC: 31.4 g/dL — ABNORMAL LOW (ref 31.5–35.7)
MCV: 101 fL — ABNORMAL HIGH (ref 79–97)
MONOS ABS: 0.5 10*3/uL (ref 0.1–0.9)
Monocytes: 10 %
NEUTROS PCT: 66 %
Neutrophils Absolute: 3.5 10*3/uL (ref 1.4–7.0)
PLATELETS: 171 10*3/uL (ref 150–379)
RBC: 4.03 x10E6/uL (ref 3.77–5.28)
RDW: 15.8 % — AB (ref 12.3–15.4)
WBC: 5.3 10*3/uL (ref 3.4–10.8)

## 2015-07-28 NOTE — Progress Notes (Signed)
Quick Note:  Labs okay, except red blood cell size a little larger than normal; can indicated beginning or ongoing vit B12 deficiency. Best to make sure she had B12 level checked recently with PCP. Nothing further needed. Please call patient or daughter. thx Star Age, MD, PhD [facility] ([facility])  ______

## 2015-07-28 NOTE — Telephone Encounter (Signed)
(  Per DPR) Left results and recommendations below on vm.

## 2015-07-28 NOTE — Telephone Encounter (Signed)
-----   Message from Star Age, MD sent at 07/28/2015  7:39 AM EDT ----- Labs okay, except red blood cell size a little larger than normal; can indicated beginning or ongoing vit B12 deficiency. Best to make sure she had B12 level checked recently with PCP. Nothing further needed. Please call patient or daughter. thx Star Age, MD, PhD [facility])

## 2015-11-21 ENCOUNTER — Encounter: Payer: Self-pay | Admitting: Neurology

## 2015-12-06 ENCOUNTER — Telehealth: Payer: Self-pay

## 2015-12-06 ENCOUNTER — Ambulatory Visit (INDEPENDENT_AMBULATORY_CARE_PROVIDER_SITE_OTHER): Payer: Medicare Other | Admitting: Neurology

## 2015-12-06 ENCOUNTER — Encounter: Payer: Self-pay | Admitting: Neurology

## 2015-12-06 VITALS — BP 150/72 | HR 78 | Resp 16 | Ht 65.25 in | Wt 140.0 lb

## 2015-12-06 DIAGNOSIS — G500 Trigeminal neuralgia: Secondary | ICD-10-CM

## 2015-12-06 NOTE — Progress Notes (Signed)
Subjective:    Patient ID: Sharon Rogers is a 82 y.o. female.  HPI      Interim history:   Sharon Rogers is a very pleasant 82 year old right-handed woman with an underlying complex medical history of colon cancer, kidney cancer, status post nephrectomy, COPD, vitamin B12 deficiency, osteoporosis, hyperlipidemia, psoriatic arthritis, anxiety, depression, vertigo and trigeminal neuralgia, who presents for follow-up consultation of her trigeminal neuralgia. The patient is accompanied by her daughter again today. I last saw her on 07/27/2015 at which time she reported that the Trileptal it helped a little bit. She was taking 150 mg twice daily and tolerating it. She was trying to eat better and drink enough water. She was also taking Tylenol and Advil, no longer hydrocodone. She only has one kidney. She was told in the past not to take any Aleve on a daily basis. I suggested we increase her Trileptal to 300 mg twice daily. We checked some blood work, we called her with results. I asked her to have her B12 level checked with her primary care physician. Of note, she reported a recent fall. She was squatting down and fell backwards and bumped her head. She told her daughter a few days later. She had an MRA head without contrast at [facility] on 05/03/2015. I reviewed the images on CD. 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 I reviewed the brain MRA report from [facility] hospital: Impression: Ectatic vertebral arteries and basilar artery do not cause impression upon the cisternal aspect of the fifth cranial nerve. Right superior cerebellar artery approaches the cisternal aspect of the right fifth cranial nerve. I referred her to Dr. Kathyrn Sheriff, she saw him in June, discussed all the options and has decided to proceed with gamma knife, but is still waiting To hear back from his office. Her daughter has called multiple times from what I understand.  Today, 12/06/2015: She reports  unchanged symptoms, the increase in Trileptal has not helped, no new issues, no falls recently, chewing does not make her symptoms worse necessarily. In the interim, developed lower extremity swelling, was diagnosed with heart failure and was placed on Lasix which she now takes once a week or so as needed, swelling has improved.     Previously:  I saw her on 04/27/2015, at which time she reported a long-standing history of trigeminal neuralgia, for which she was followed by Dr. Francesco Runner for years, up until recently. She had tried multiple medications in the past including Tegretol immediate release, gabapentin, Lyrica, hydrocodone. She had a fall in October 2016, striking her head on the small table. She sustained bruises to her arm and right-sided chest. She was on sertraline for her mood disorder. She had stopped driving. Her sertraline was 100 mg once daily. She had issues with tolerance of Tegretol including nausea and vomiting. Lyrica did not help. She had also seen a nurse practitioner, Ms. San Jetty, at [facility], who had prescribed the Lyrica. She was using hydrocodone 5-325 mg as needed. Her symptoms date back to 2008 she started having right-sided neuralgic pain and numbness in the right midface. She reported daily attacks triggered by chewing or eating. She would have to skip meals because of fear of exacerbation of her pain. She also was not always drinking enough water. Of note, she only has one kidney. I suggested we switch her to Tegretol-XR twice daily. In the interim, in early April 2017 her daughter called back with flareup of the pain which was not  responding to the long-acting Tegretol or hydrocodone. I suggested we tapered her off of the Tegretol-XR and started titration for Trileptal.      I saw her on 04/23/2013, at which time she reported feeling improved with her respect to her vertigo. She had some changes in her medications and was on a lower dose of  Tegretol, was off of nortriptyline and off of Zoloft. She was on Cymbalta twice daily. She had finished physical therapy and felt that it was helpful for her vertigo. She was advised to follow-up as needed.    Of note, she went to the emergency room at [facility] on 04/24/2015. She was diagnosed with dehydration and urinary tract infection. She was prescribed Omnicef for 5 days and Zofran for nausea. I reviewed the hospital records she brought in today. Urinalysis showed cloudy appearance urine nitrite positive, leukocyte esterase moderately high, CMP showed BUN of 21, creatinine 1.15, otherwise fairly unremarkable findings.   I first met her on 10/07/2012, and which time I felt she had BPPV. She had a nonfocal exam at the time. I asked her to stay well-hydrated, change positions slowly and suggested that she consider coming off of the [place] as it can contribute to sedation and balance issues. I suggested physical therapy for vestibular rehabilitation and she wanted to do this locally. I also ordered a brain MRI with and without contrast. She brain MRI with and without contrast at [facility] on 10/14/2012 and I reviewed the report on 10/15/2012: Impression: no acute infarct, prominent small vessel disease type changes, global atrophy, no intracranial mass or abnormal enhancement, mild paranasal sinus mucosal thickening most notable on the right.   At the time of her first visit she reported feeling off balance off and on for years. She had had some falls. Symptoms became worse in the last 6-7 months prior to her first visit. She had been on Tegretol for trigeminal neuralgia but stopped taking it in the morning because of drowsiness. She is status post right hemicolectomy in 2004, cholecystectomy in 2004, left kidney removal in 2005 and has a remote history of back surgery in 1969.   She sees Dr. Francesco Runner for pain management, in particular her TGN.    Her Past Medical History  Is Significant For: Past Medical History:  Diagnosis Date  . Anxiety   . Arthritis   . Benign paroxysmal positional vertigo 10/07/2012  . BPPV (benign paroxysmal positional vertigo)   . Colon cancer ([place])   . COPD (chronic obstructive pulmonary disease) ([place])   . Depression   . Heart failure ([place])   . Kidney cancer, primary, with metastasis from kidney to other site [facility])   . Trigeminal neuralgia   . Trigeminal neuralgia     Her Past Surgical History Is Significant For: Past Surgical History:  Procedure Laterality Date  . CESAREAN SECTION  [phone]  . CHOLECYSTECTOMY  2004  . CYST EXCISION  1978   Base of spine  . HEMICOLECTOMY Right 2004  . KIDNEY SURGERY Left 2005   removed    Her Family History Is Significant For: Family History  Problem Relation Age of Onset  . Stroke Father   . Cancer Sister     Breast  . Cancer Sister     Breast  . Cancer Sister     Breast    Her Social History Is Significant For: Social History   Social History  . Marital status: Married    Spouse name: N/A  .  Number of children: 3  . Years of education: HS   Occupational History  . Retired    Social History Main Topics  . Smoking status: Former Smoker    Types: Cigarettes    Quit date: 10/08/1979  . Smokeless tobacco: None  . Alcohol use No  . Drug use: No  . Sexual activity: Not Asked   Other Topics Concern  . None   Social History Narrative   Patient drinks 1 coke a day     Her Allergies Are:  Allergies  Allergen Reactions  . Penicillins Itching    Blisters  . Phenergan [Promethazine Hcl] Other (See Comments)    Psychological   . Sulfa Antibiotics   . Tramadol   :   Her Current Medications Are:  Outpatient Encounter Prescriptions as of 12/06/2015  Medication Sig  . acetaminophen (TYLENOL) 500 MG tablet Take 500 mg by mouth every Thursday.  . folic acid (FOLVITE) 1 MG tablet Take 1 mg by mouth daily.  . furosemide (LASIX) 20 MG tablet Take 20 mg by  mouth.  . methotrexate (RHEUMATREX) 2.5 MG tablet   . ondansetron (ZOFRAN) 4 MG tablet TK 1 T PO Q 8 H PRF NAUSEA  . OXcarbazepine (TRILEPTAL) 300 MG tablet Take 1 tablet (300 mg total) by mouth 2 (two) times daily.  . Probiotic Product ([place]) Take by mouth.  . ranitidine (ZANTAC) 150 MG capsule Take 150 mg by mouth 2 (two) times daily.  . sertraline (ZOLOFT) 100 MG tablet Take 100 mg by mouth daily.  . [DISCONTINUED] ibuprofen (ADVIL,MOTRIN) 200 MG tablet Take 200 mg by mouth as needed.   No facility-administered encounter medications on file as of 12/06/2015.   :  Review of Systems:  Out of a complete 14 point review of systems, all are reviewed and negative with the exception of these symptoms as listed below:  Review of Systems  Cardiovascular:       Patient was recently diagnosed with heart failure in July and started on Lasix as needed.   Neurological:       Daughter reports that patient's medication is no longer working. They are trying to schedule surgery/procedure for trigeminal neuralgia, but have not heard anything from referring office. (Started process 6 weeks ago)    Objective:  Neurologic Exam  Physical Exam Physical Examination:   Vitals:   12/06/15 1059  BP: (!) 150/72  Pulse: 78  Resp: 16   General Examination: The patient is a very pleasant 82 y.o. female in no acute distress. She appears well-developed and well-nourished and well groomed.   HEENT: Normocephalic, atraumatic, pupils are equal, round and reactive to light and accommodation. She has bilateral cataracts. Extraocular tracking is good without limitation to gaze excursion or nystagmus noted. Normal smooth pursuit is noted. Hearing is grossly intact. Face is symmetric with normal facial animation and normal facial sensation on the left but she does have sensitivity to touch and temperature in the right mid face. She has currently no significant painful attacks, but describes a  constant achiness in her right mid face, starting from the below the nose area on the right. Speech is clear with no dysarthria noted. There is no hypophonia. There is no lip, neck/head, jaw or voice tremor. Neck is supple with full range of passive and active motion. There are no carotid bruits on auscultation. Oropharynx exam reveals: moderate mouth dryness, adequate dental hygiene and mild airway crowding, due to narrow airway. Mallampati is class II.  Tongue protrudes centrally and palate elevates symmetrically.   Chest: Clear to auscultation without wheezing, rhonchi or crackles noted.  Heart: S1+S2+0, regular and normal without murmurs, rubs or gallops noted.   Abdomen: Soft, non-tender and non-distended with normal bowel sounds appreciated on auscultation.  Extremities: There is trace pitting edema in the distal lower extremities bilaterally. Pedal pulses are intact.    Skin: Warm and dry without trophic changes noted. There are no varicose veins.  Musculoskeletal: exam reveals no obvious joint deformities, tenderness or joint swelling or erythema.   Neurologically:  Mental status: The patient is awake, alert and oriented in all 4 spheres. Her memory, attention, language and knowledge are appropriate. There is no aphasia, agnosia, apraxia or anomia. Speech is clear with normal prosody and enunciation. Thought process is linear. Mood is congruent and affect is normal.  Cranial nerves are as described above under HEENT exam. In addition, shoulder shrug is normal with equal shoulder height noted. Motor exam: Normal bulk, strength and tone is noted. There is no drift, tremor or rebound. Romberg shows sway. Reflexes are 1+ throughout. Fine motor skills are intact with normal finger taps, normal hand movements, normal rapid alternating patting, normal foot taps and normal foot agility.  Cerebellar testing shows no dysmetria or intention tremor on finger to nose testing.   Sensory exam is intact to  light touch in the upper and lower extremities.  Gait, station and balance: she stands up with mild insecurity. She stands slightly wide-based. Posture is age-appropriate. She walks slightly insecurely without her cane, better with her 4 pronged cane. Tandem walk is not possible for her.                 Assessment and Plan:   In summary, Sharon Rogers is a very pleasant 82 year old female with an underlying complex medical history of colon cancer, kidney cancer, status post nephrectomy, COPD, vitamin B12 deficiency, osteoporosis, hyperlipidemia, psoriatic arthritis, anxiety, depression, vertigo, who presents for initial consultation of her right-sided trigeminal neuralgia, of several years duration, with symptoms dating back to 2008. She has tried multiple medications but had the best results with Tegretol 200 mg twice daily. Unfortunately, she has had issues with nausea, particularly exacerbated by having an empty stomach. Her facial pain exacerbate sometimes when she eats, which leads to a perpetual vicious cycle. Nevertheless, we tried switching it to Tegretol-XR 200 mg twice daily, but she had residual pain. She has been off of hydrocodone and has been alternating Tylenol and Advil. Because of her single kidney, she has to be extra cautious with nonsteroidals. In the interim, she has developed lower extremity swelling which is better now, has been given Lasix per primary care physician which she has been using infrequently now. We switched her to Trileptal, increased it last time, no significant improvement thus far, I made a referral to Dr. Kathyrn Sheriff in May 2017, she saw him in June 2017, we will request office notes. Furthermore, possible procedures were discussed including gamma knife from what I understand and patient had a chance to think about it and decided to proceed with it but has contacted his office and is waiting to hear back, but it has been several weeks from what I understand. We will  call the office and see if we can expedite the process and also request office notes. From my end of things, exam is stable, I suggested we continue with her medication but advised against increasing it for now. We will follow-up as  needed, depending on how she proceeds with this surgical intervention.  I answered all their questions today and the patient and her daughter were in agreement with the above plan. I spent 25 minutes in total face-to-face time with the patient, more than 50% of which was spent in counseling and coordination of care, reviewing test results, reviewing medication and discussing or reviewing the diagnosis of TGN, its prognosis and treatment options.

## 2015-12-06 NOTE — Patient Instructions (Signed)
We will get in touch with Dr. Cleotilde Neer office for office notes and to help further your process for possible gamma knife surgery for your trigeminal neuralgia.

## 2015-12-06 NOTE — Telephone Encounter (Signed)
LM to call back.

## 2015-12-06 NOTE — Telephone Encounter (Signed)
Brandy with [facility] is returning your call regarding the patient.

## 2015-12-06 NOTE — Telephone Encounter (Signed)
Theadora Rama called me back and states that a referral was placed for patient to be seen by [facility]iology Oncology. I advised her that patient has not heard anything back. Theadora Rama will let Lexine Baton (with referrals) know so that she can address.

## 2015-12-06 NOTE — Telephone Encounter (Signed)
I called [facility] Dr. Cleotilde Neer office to request office note from June. Also patient would like to pursue surgical intervention for her condition but has not heard anything back from the office. Daughter states that she has called the office several times but has not heard back. After talking to medical records I was transferred and left a message for Loletha Grayer? To call me back in regards to the status of surgery request.

## 2015-12-07 ENCOUNTER — Other Ambulatory Visit: Payer: Self-pay | Admitting: Radiation Therapy

## 2015-12-07 ENCOUNTER — Encounter: Payer: Self-pay | Admitting: Radiation Therapy

## 2015-12-07 DIAGNOSIS — G500 Trigeminal neuralgia: Secondary | ICD-10-CM

## 2015-12-07 NOTE — Telephone Encounter (Signed)
I called daughter and gave information below to keep her in the loop. Daughter has been advised to call me back with any further trouble.

## 2015-12-07 NOTE — Progress Notes (Signed)
1.  Do you need a wheel chair?    Walks with a cane, but tires easily. May need a wheelchair for long distances  2. On oxygen? no  3. Have you ever had any surgery in the body part being scanned?  no  4. Have you ever had any surgery on your brain or heart?  no                                                        5. Have you ever had surgery on your eyes or ears?    no                                                     6. Do you have a pacemaker or defibrillator?   no  7. Do you have a Neurostimulator?  no  8. Claustrophobic?  no  9. Any risk for metal in eyes?  no  10. Injury by bullet, buckshot, or shrapnel?  no  11. Stent?    no    12. Hx of Cancer?      Yes, but this scan is for treatment planning of known Rt sided trigeminal neuralgia, not her cancer diagnosis.                                                                                                       13. Kidney or Liver disease?  no  14. Hx of Lupus, Rheumatoid Arthritis or Scleroderma?  no  15. IV Antibiotics or long term use of NSAIDS?  no  16. HX of Hypertension?  no  17. Diabetes?  no  18. Allergy to contrast?  no  19. Recent labs. No, she will need labs drawn there for her scan  All questions answered by pt's daughter over the phone on 9/13.   Mont Dutton R.T.(R)(T) Special Procedures Navigator

## 2015-12-16 ENCOUNTER — Ambulatory Visit
Admission: RE | Admit: 2015-12-16 | Discharge: 2015-12-16 | Disposition: A | Discharge status: Home / Self Care | Payer: Medicare Other | Source: Ambulatory Visit | Attending: Radiation Oncology | Admitting: Radiation Oncology

## 2015-12-16 DIAGNOSIS — G500 Trigeminal neuralgia: Secondary | ICD-10-CM

## 2015-12-16 MED ORDER — GADOBENATE DIMEGLUMINE 529 MG/ML IV SOLN
13.0000 mL | Freq: Once | INTRAVENOUS | Status: AC | PRN
  Administered 2015-12-16: 13 mL via INTRAVENOUS

## 2015-12-21 ENCOUNTER — Other Ambulatory Visit: Payer: Medicare Other

## 2015-12-21 ENCOUNTER — Encounter: Payer: Self-pay | Admitting: Radiation Oncology

## 2015-12-21 ENCOUNTER — Ambulatory Visit
Admission: RE | Admit: 2015-12-21 | Discharge: 2015-12-21 | Disposition: A | Discharge status: Home / Self Care | Payer: Medicare Other | Source: Ambulatory Visit | Attending: Radiation Oncology | Admitting: Radiation Oncology

## 2015-12-21 ENCOUNTER — Ambulatory Visit: Payer: Medicare Other

## 2015-12-21 DIAGNOSIS — Z85038 Personal history of other malignant neoplasm of large intestine: Secondary | ICD-10-CM | POA: Insufficient documentation

## 2015-12-21 DIAGNOSIS — E538 Deficiency of other specified B group vitamins: Secondary | ICD-10-CM | POA: Insufficient documentation

## 2015-12-21 DIAGNOSIS — M1990 Unspecified osteoarthritis, unspecified site: Secondary | ICD-10-CM | POA: Insufficient documentation

## 2015-12-21 DIAGNOSIS — J449 Chronic obstructive pulmonary disease, unspecified: Secondary | ICD-10-CM | POA: Insufficient documentation

## 2015-12-21 DIAGNOSIS — C649 Malignant neoplasm of unspecified kidney, except renal pelvis: Secondary | ICD-10-CM | POA: Insufficient documentation

## 2015-12-21 DIAGNOSIS — Z87891 Personal history of nicotine dependence: Secondary | ICD-10-CM | POA: Insufficient documentation

## 2015-12-21 DIAGNOSIS — M810 Age-related osteoporosis without current pathological fracture: Secondary | ICD-10-CM | POA: Insufficient documentation

## 2015-12-21 DIAGNOSIS — Z510 Encounter for antineoplastic radiation therapy: Secondary | ICD-10-CM | POA: Insufficient documentation

## 2015-12-21 DIAGNOSIS — F329 Major depressive disorder, single episode, unspecified: Secondary | ICD-10-CM | POA: Insufficient documentation

## 2015-12-21 DIAGNOSIS — E785 Hyperlipidemia, unspecified: Secondary | ICD-10-CM | POA: Insufficient documentation

## 2015-12-21 DIAGNOSIS — G500 Trigeminal neuralgia: Secondary | ICD-10-CM | POA: Insufficient documentation

## 2015-12-21 DIAGNOSIS — I509 Heart failure, unspecified: Secondary | ICD-10-CM | POA: Insufficient documentation

## 2015-12-21 DIAGNOSIS — H8110 Benign paroxysmal vertigo, unspecified ear: Secondary | ICD-10-CM | POA: Insufficient documentation

## 2015-12-21 DIAGNOSIS — F419 Anxiety disorder, unspecified: Secondary | ICD-10-CM | POA: Insufficient documentation

## 2015-12-21 HISTORY — DX: Malignant (primary) neoplasm, unspecified: C80.1

## 2015-12-21 HISTORY — DX: Hyperlipidemia, unspecified: E78.5

## 2015-12-21 HISTORY — DX: Deficiency of other specified B group vitamins: E53.8

## 2015-12-21 HISTORY — DX: Age-related osteoporosis without current pathological fracture: M81.0

## 2015-12-21 NOTE — Progress Notes (Signed)
82 year old female, who presents for consultation for trigeminal neuralgia. HX of colon and kidney cancer, heart failure, nephrectomy,  B12 deficiency, depression, arthritis and COPD.   Her symptoms began back in 2008 with right sided neuralgic pain and numbness in the right midface. To date facial pain exacerbation occurs sometimes when she eats. Also, patient reports nausea and vertigo. Patient has had numerous falls. Recently, she fell breaking her nose and elbow.   Ax: penicillins, phenergan, sulfa and tramadol Hx of radiation therapy: ?? Patient is not taking methotrexate Patient is not pregnant

## 2015-12-23 ENCOUNTER — Ambulatory Visit: Payer: Medicare Other | Admitting: Radiation Oncology

## 2015-12-23 ENCOUNTER — Ambulatory Visit: Payer: Medicare Other

## 2015-12-29 ENCOUNTER — Encounter: Payer: Self-pay | Admitting: Radiation Oncology

## 2016-01-30 ENCOUNTER — Other Ambulatory Visit: Payer: Self-pay | Admitting: Neurology

## 2016-01-30 DIAGNOSIS — G500 Trigeminal neuralgia: Secondary | ICD-10-CM

## 2016-02-06 ENCOUNTER — Ambulatory Visit
Admission: RE | Admit: 2016-02-06 | Discharge: 2016-02-06 | Disposition: A | Discharge status: Home / Self Care | Payer: Medicare Other | Source: Ambulatory Visit | Attending: Radiation Oncology | Admitting: Radiation Oncology

## 2016-02-06 ENCOUNTER — Encounter: Payer: Self-pay | Admitting: Radiation Oncology

## 2016-02-06 VITALS — BP 151/78 | HR 83 | Resp 16 | Ht 65.0 in | Wt 132.2 lb

## 2016-02-06 DIAGNOSIS — G500 Trigeminal neuralgia: Secondary | ICD-10-CM | POA: Diagnosis present

## 2016-02-06 DIAGNOSIS — H8110 Benign paroxysmal vertigo, unspecified ear: Secondary | ICD-10-CM | POA: Diagnosis not present

## 2016-02-06 DIAGNOSIS — Z510 Encounter for antineoplastic radiation therapy: Secondary | ICD-10-CM | POA: Diagnosis present

## 2016-02-06 DIAGNOSIS — M810 Age-related osteoporosis without current pathological fracture: Secondary | ICD-10-CM | POA: Diagnosis not present

## 2016-02-06 DIAGNOSIS — E785 Hyperlipidemia, unspecified: Secondary | ICD-10-CM | POA: Diagnosis not present

## 2016-02-06 DIAGNOSIS — J449 Chronic obstructive pulmonary disease, unspecified: Secondary | ICD-10-CM | POA: Diagnosis not present

## 2016-02-06 DIAGNOSIS — M1990 Unspecified osteoarthritis, unspecified site: Secondary | ICD-10-CM | POA: Diagnosis not present

## 2016-02-06 DIAGNOSIS — F419 Anxiety disorder, unspecified: Secondary | ICD-10-CM | POA: Diagnosis not present

## 2016-02-06 DIAGNOSIS — C649 Malignant neoplasm of unspecified kidney, except renal pelvis: Secondary | ICD-10-CM | POA: Diagnosis not present

## 2016-02-06 DIAGNOSIS — E538 Deficiency of other specified B group vitamins: Secondary | ICD-10-CM | POA: Diagnosis not present

## 2016-02-06 DIAGNOSIS — Z85038 Personal history of other malignant neoplasm of large intestine: Secondary | ICD-10-CM | POA: Diagnosis not present

## 2016-02-06 DIAGNOSIS — F329 Major depressive disorder, single episode, unspecified: Secondary | ICD-10-CM | POA: Diagnosis not present

## 2016-02-06 DIAGNOSIS — I509 Heart failure, unspecified: Secondary | ICD-10-CM | POA: Diagnosis not present

## 2016-02-06 DIAGNOSIS — Z87891 Personal history of nicotine dependence: Secondary | ICD-10-CM | POA: Diagnosis not present

## 2016-02-06 NOTE — Progress Notes (Signed)
[place]         [phone] ________________________________  Initial Outpatient Consultation  Name: Jamaika Brasseaux MRN: [id number]  Date: 02/06/2016  DOB: September 11, 1929  REFERRING PHYSICIAN: Consuella Lose, MD  DIAGNOSIS: 82 y.o. woman with right-sided trigeminal neuralgia    ICD-9-CM ICD-10-CM   1. Trigeminal neuralgia 350.1 G50.0 Ambulatory referral to Social Work  2. Trigeminal neuralgia of right side of face 350.1 G50.0     HISTORY OF PRESENT ILLNESS::Akeyla Stuart Paris is a 82 y.o. female who who presents for consultation for trigeminal neuralgia. The patient has a history of colon and kidney cancer, heart failure, nephrectomy, B12 deficiency, depression, arthritis, and COPD. Her symptoms began back in 2008 with right sided neuralgic pain and numbness in the right midface and sometimes involving the lower jaw.  The pain started without any identificable incident several years ago. She was initially treated with Tegretol which worked well until a few months ago. Since then, she has tried multiple medications; including Lyria, Nortriptyline, Cymbalta, and Hydrocodone. More recently, she has been seeing Dr. Rexene Alberts (of [facility]) and was started on Trileptal. The patiented stated that the Trileptal improved her pain marginally, but made her sleepy.  The patient was then referred to Dr. Kathyrn Sheriff on 08/29/15. Dr. Kathyrn Sheriff discussed continuing to work with Dr. Dia Sitter to see if the benefit from Trileptal could be maximized. If that was not possible, then more interventional management, such as stereotactic radiosurgery would be considered. The patient presents today to discuss possible SRS.  MRI of the brain on 12/16/15 revealed no mass or abnormal enhancement of the trigeminal nerve complexes, there is neurovascular contact upon the right cisternal trigeminal nerve by the right ICA with mild lateral displacement of the nerve, and moderate  chronic microvascular ischemic changes and mild parenchymal volume loss of the brain for age.  PREVIOUS RADIATION THERAPY: No  Past Medical History:  Diagnosis Date  . Anxiety   . Arthritis   . B12 deficiency   . Benign paroxysmal positional vertigo 10/07/2012  . BPPV (benign paroxysmal positional vertigo)   . Cancer [facility]    kidney cancer  . Colon cancer ([place])   . COPD (chronic obstructive pulmonary disease) ([place])   . Depression   . Heart failure ([place])   . Hyperlipemia   . Kidney cancer, primary, with metastasis from kidney to other site [facility])   . Osteoporosis   . Trigeminal neuralgia   . Trigeminal neuralgia   :   Past Surgical History:  Procedure Laterality Date  . CESAREAN SECTION  [phone]  . CHOLECYSTECTOMY  2004  . CYST EXCISION  1978   Base of spine  . HEMICOLECTOMY Right 2004  . KIDNEY SURGERY Left 2005   removed  :   Current Outpatient Prescriptions:  .  acetaminophen (TYLENOL) 500 MG tablet, Take 500 mg by mouth every Thursday., Disp: , Rfl:  .  folic acid (FOLVITE) 1 MG tablet, Take 1 mg by mouth daily., Disp: , Rfl:  .  furosemide (LASIX) 20 MG tablet, Take 20 mg by mouth., Disp: , Rfl:  .  Lactobacillus (PROBIOTIC ACIDOPHILUS PO), Take by mouth., Disp: , Rfl:  .  Oxcarbazepine (TRILEPTAL) 300 MG tablet, TAKE 1 TABLET(300 MG) BY MOUTH TWICE DAILY, Disp: 60 tablet, Rfl: 5 .  Probiotic Product ([place]), Take by mouth., Disp: , Rfl:  .  ranitidine (ZANTAC) 150 MG capsule, Take 75 mg by mouth 2 (two) times daily. , Disp: , Rfl:  .  sertraline (ZOLOFT) 100 MG tablet, Take 100 mg by mouth daily., Disp: , Rfl:  .  ondansetron (ZOFRAN) 4 MG tablet, TK 1 T PO Q 8 H PRF NAUSEA, Disp: , Rfl: 2:   Allergies  Allergen Reactions  . Penicillins Itching    Blisters  . Phenergan [Promethazine Hcl] Other (See Comments)    Psychological   . Sulfa Antibiotics Rash  :   Family History  Problem Relation Age of Onset  . Stroke Father   .  Cancer Sister     Breast  . Cancer Sister     Breast  . Cancer Sister     Breast  :   Social History   Social History  . Marital status: Married    Spouse name: N/A  . Number of children: 3  . Years of education: HS   Occupational History  . Retired    Social History Main Topics  . Smoking status: Former Smoker    Packs/day: 0.50    Years: 30.00    Types: Cigarettes    Quit date: 10/08/1979  . Smokeless tobacco: Never Used  . Alcohol use No  . Drug use: No  . Sexual activity: No   Other Topics Concern  . Not on file   Social History Narrative   Patient drinks 1 coke a day   :  REVIEW OF SYSTEMS:  A 15 point review of systems is documented in the electronic medical record. This was obtained by the nursing staff. However, I reviewed this with the patient to discuss relevant findings and make appropriate changes.  Pertinent items are noted in HPI. Pertinent items noted in HPI and remainder of comprehensive ROS otherwise negative.   To date facial pain exacerbation when she eats, when she is out on a windy day, or touches the right side of her face. The patient describes the pain as "a lighting strike that last for 1/2 a minute." She reports the neuralgia resolved after her fall, but returned with great intensity two weeks ago. The patient's daughter, Nira Conn, reports her mother would fall into a cycle of not eating to avoid the pain. Reports increased episodes of dizziness noted recently.Patient reports nausea and vertigo resolved after she stopped methotrexate. Patient has had numerous falls. Recently, she fell breaking her nose and elbow. Patient scheduled to follow up with orthopedic tomorrow reference left elbow.   PHYSICAL EXAM:  Blood pressure (!) 151/78, pulse 83, resp. rate 16, height 5\' 5"  (1.651 m), weight 132 lb 3.2 oz (60 kg), SpO2 100 %. In general this is a well appearing Caucasian female in no acute distress. She is alert and oriented x4 and appropriate  throughout the examination. Presents in a wheelchair and her left arm is in a sling. There is a scar above her right eyebrow.  Per neurology:         General Examination: The patient is a very pleasant 82 y.o. female in no acute distress. She appears well-developed and well-nourished and well groomed.  HEENT: Normocephalic, atraumatic, pupils are equal, round and reactive to light and accommodation. She has bilateral cataracts. Extraocular tracking is good without limitation to gaze excursion or nystagmus noted. Normal smooth pursuit is noted. Hearing is grossly intact. Face is symmetric with normal facial animation and normal facial sensation on the left but she does have sensitivity to touch and temperature in the right mid face. She has currently no significant painful attacks, but describes a constant achiness in her right mid face,  starting from the below the nose area on the right. Speech is clear with no dysarthria noted. There is no hypophonia. There is no lip, neck/head, jaw or voice tremor. Neck is supple with full range of passive and active motion. There are no carotid bruits on auscultation. Oropharynx exam reveals: moderate mouth dryness, adequate dental hygiene and mild airway crowding, due to narrow airway. Mallampati is class II. Tongue protrudes centrally and palate elevates symmetrically.  Chest: Clear to auscultation without wheezing, rhonchi or crackles noted. Heart: S1+S2+0, regular and normal without murmurs, rubs or gallops noted.  Abdomen: Soft, non-tender and non-distended with normal bowel sounds appreciated on auscultation. Extremities: There is trace pitting edema in the distal lower extremities bilaterally. Pedal pulses are intact.   Skin: Warm and dry without trophic changes noted. There are no varicose veins. Musculoskeletal: exam reveals no obvious joint deformities, tenderness or joint swelling or erythema.  Neurologically:  Mental status: The patient is awake,  alert and oriented in all 4 spheres. Her memory, attention, language and knowledge are appropriate. There is no aphasia, agnosia, apraxia or anomia. Speech is clear with normal prosody and enunciation. Thought process is linear. Mood is congruent and affect is normal.  Cranial nerves are as described above under HEENT exam. In addition, shoulder shrug is normal with equal shoulder height noted. Motor exam: Normal bulk, strength and tone is noted. There is no drift, tremor or rebound. Romberg shows sway. Reflexes are 1+ throughout. Fine motor skills are intact with normal finger taps, normal hand movements, normal rapid alternating patting, normal foot taps and normal foot agility.  Cerebellar testing shows no dysmetria or intention tremor on finger to nose testing.   Sensory exam is intact to light touch in the upper and lower extremities.  Gait, station and balance: she stands up with mild insecurity. She stands slightly wide-based. Posture is age-appropriate. She walks slightly insecurely without her cane, better with her 4 pronged cane. Tandem walk is not possible for her.          KPS = 80  100 - Normal; no complaints; no evidence of disease. 90   - Able to carry on normal activity; minor signs or symptoms of disease. 80   - Normal activity with effort; some signs or symptoms of disease. 62   - Cares for self; unable to carry on normal activity or to do active work. 60   - Requires occasional assistance, but is able to care for most of his personal needs. 50   - Requires considerable assistance and frequent medical care. 70   - Disabled; requires special care and assistance. 17   - Severely disabled; hospital admission is indicated although death not imminent. 60   - Very sick; hospital admission necessary; active supportive treatment necessary. 10   - Moribund; fatal processes progressing rapidly. 0     - Dead  Karnofsky DA, Abelmann [place], Craver LS and Burchenal [facility] [phone]) The use of the  nitrogen mustards in the palliative treatment of carcinoma: with particular reference to bronchogenic carcinoma Cancer 1 634-56  LABORATORY DATA:  Lab Results  Component Value Date   WBC 5.3 07/27/2015   HCT 40.5 07/27/2015   MCV 101 (H) 07/27/2015   PLT 171 07/27/2015   Lab Results  Component Value Date   NA 143 07/27/2015   K 4.4 07/27/2015   CL 103 07/27/2015   CO2 27 07/27/2015   Lab Results  Component Value Date   ALT 5 07/27/2015  AST 10 07/27/2015   ALKPHOS 77 07/27/2015   BILITOT 0.3 07/27/2015     RADIOGRAPHY: No results found.    IMPRESSION: 82 y.o. woman with trigeminal neuralgia of the right side.  The patient would be eligible for stereotactic radiosurgery.  PLAN: Today, I talked to the patient and family about the findings and work-up thus far.  We discussed the natural history of trigeminal neuralgia and general treatment, highlighting the role of stereotactic radioradiosurgery in the management.  We discussed the available radiation techniques, and focused on the details of logistics and delivery.  We reviewed the anticipated acute and late sequelae associated with radiation in this setting.  The patient was encouraged to ask questions that I answered to the best of my ability. The patient would like to proceed with radiation and will be scheduled for CT simulation.  I spent 60 minutes minutes face to face with the patient and more than 50% of that time was spent in counseling and/or coordination of care.   ------------------------------------------------ ------------------------------------------------   Tyler Pita, MD [place] Director and Director of Stereotactic Radiosurgery Direct Dial: [phone]  Fax: [phone] [email or web address]  Skype  [email or web address]  This document serves as a record of services personally performed by Tyler Pita, MD. It was created on his behalf by Darcus Austin, a trained medical scribe. The  creation of this record is based on the scribe's personal observations and the provider's statements to them. This document has been checked and approved by the attending provider.

## 2016-02-06 NOTE — Progress Notes (Signed)
82 year old female, who presents for consultation for trigeminal neuralgia. HX of colon and kidney cancer, heart failure, nephrectomy,  B12 deficiency, depression, arthritis and COPD.   Her symptoms began back in 2008 with right sided neuralgic pain and numbness in the right midface. To date facial pain exacerbation when she eats, she is out on a windy day or touches the right side of her face. Patient describes the pain as "a lighting strike that last for 1/2 a minute." She reports the neuralgia resolved after her fall but, returned with great intensity two weeks ago. Patient daughter, Nira Conn, reports her mother while fall into a cycle of not eating to avoid pain. Reports increased episodes of dizziness noted recently.Patient reports nausea and vertigo resolved after she stopped methotrexate. Patient has had numerous falls. Recently, she fell breaking her nose and elbow. Patient scheduled to follow up with orthopedic tomorrow reference left elbow.   Ax: penicillins, phenergan, and sulfa Hx of radiation therapy: no Patient stopped taking methotrexate 2 weeks ago after taking it for ten years. Patient is not pregnant

## 2016-02-06 NOTE — Progress Notes (Signed)
See progress note under physician encounter. 

## 2016-02-09 ENCOUNTER — Ambulatory Visit
Admission: RE | Admit: 2016-02-09 | Discharge: 2016-02-09 | Disposition: A | Discharge status: Home / Self Care | Payer: Medicare Other | Source: Ambulatory Visit | Attending: Radiation Oncology | Admitting: Radiation Oncology

## 2016-02-09 ENCOUNTER — Other Ambulatory Visit: Payer: Self-pay | Admitting: Radiation Oncology

## 2016-02-09 ENCOUNTER — Other Ambulatory Visit: Payer: Self-pay | Admitting: Radiation Therapy

## 2016-02-09 DIAGNOSIS — C801 Malignant (primary) neoplasm, unspecified: Secondary | ICD-10-CM

## 2016-02-09 DIAGNOSIS — G500 Trigeminal neuralgia: Secondary | ICD-10-CM

## 2016-02-13 ENCOUNTER — Ambulatory Visit
Admission: RE | Admit: 2016-02-13 | Discharge: 2016-02-13 | Disposition: A | Discharge status: Home / Self Care | Payer: Medicare Other | Source: Ambulatory Visit | Attending: Radiation Oncology | Admitting: Radiation Oncology

## 2016-02-13 ENCOUNTER — Ambulatory Visit: Payer: Medicare Other

## 2016-02-13 DIAGNOSIS — G500 Trigeminal neuralgia: Secondary | ICD-10-CM

## 2016-02-13 DIAGNOSIS — Z510 Encounter for antineoplastic radiation therapy: Secondary | ICD-10-CM | POA: Diagnosis not present

## 2016-02-13 NOTE — Progress Notes (Signed)
  Radiation Oncology         [phone]) [phone] ________________________________  Name: Sharon Rogers MRN: [id number]  Date: 02/13/2016  DOB: [birth year]  SIMULATION AND TREATMENT PLANNING NOTE    ICD-9-CM ICD-10-CM   1. Trigeminal neuralgia of right side of face 350.1 G50.0     DIAGNOSIS:  Right trigeminal neuralgia  NARRATIVE:  The patient was brought to the [facility].  Identity was confirmed.  All relevant records and images related to the planned course of therapy were reviewed.  The patient freely provided informed written consent to proceed with treatment after reviewing the details related to the planned course of therapy. The consent form was witnessed and verified by the simulation staff. Intravenous access was established for contrast administration. Then, the patient was set-up in a stable reproducible supine position for radiation therapy.  A relocatable thermoplastic stereotactic head frame was fabricated for precise immobilization.  CT images were obtained.  Surface markings were placed.  The CT images were loaded into the planning software and fused with the patient's targeting MRI scan.  Then the target and avoidance structures were contoured.  Treatment planning then occurred.  The radiation prescription was entered and confirmed.  I have requested 3D planning  I have requested a DVH of the following structures: Brain stem, brain, left eye, right eye, lenses, optic chiasm, target volumes, uninvolved brain, and normal tissue.    SPECIAL TREATMENT PROCEDURE:  The planned course of therapy using radiation constitutes a special treatment procedure. Special care is required in the management of this patient for the following reasons. This treatment constitutes a Special Treatment Procedure for the following reason: High dose per fraction requiring special monitoring for increased toxicities of treatment including daily imaging.  The special nature of the planned course  of radiotherapy will require increased physician supervision and oversight to ensure patient's safety with optimal treatment outcomes.  PLAN:  The patient will receive 90 Gy in 1 fraction to isocenter with the 50% isodose line touching brainstem  ________________________________  Sheral Apley. Tammi Klippel, M.D.

## 2016-02-17 ENCOUNTER — Encounter: Payer: Self-pay | Admitting: *Deleted

## 2016-02-17 NOTE — Progress Notes (Signed)
[place] Psychosocial Distress Screening Clinical Social Work  Clinical Social Work was referred by distress screening protocol.  The patient scored a 5 on the Psychosocial Distress Thermometer which indicates mild distress. Clinical Social Worker reviewed chart to assess for distress and other psychosocial needs. Pt currently not being treated for cancer and was screened in error.   ONCBCN DISTRESS SCREENING 02/06/2016  Screening Type Initial Screening  Distress experienced in past week (1-10) 5  Emotional problem type Depression;Adjusting to illness;Feeling hopeless  Physical Problem type Pain;Getting around;Bathing/dressing;Breathing  Physician notified of physical symptoms Yes  Referral to clinical psychology No  Referral to clinical social work No  Referral to dietition No  Referral to financial advocate No  Referral to support programs No  Referral to palliative care No     Clinical Social Worker follow up needed: No.  If yes, follow up plan: Loren Racer, [place]  [facility] Phone: [phone] Fax: [phone]

## 2016-02-20 ENCOUNTER — Ambulatory Visit: Payer: Medicare Other | Admitting: Radiation Oncology

## 2016-02-22 ENCOUNTER — Ambulatory Visit: Admission: RE | Admit: 2016-02-22 | Payer: Medicare Other | Source: Ambulatory Visit | Admitting: Radiation Oncology

## 2016-02-22 DIAGNOSIS — Z510 Encounter for antineoplastic radiation therapy: Secondary | ICD-10-CM | POA: Diagnosis not present

## 2016-02-23 ENCOUNTER — Encounter: Payer: Self-pay | Admitting: Radiation Oncology

## 2016-02-23 ENCOUNTER — Ambulatory Visit
Admission: RE | Admit: 2016-02-23 | Discharge: 2016-02-23 | Disposition: A | Discharge status: Home / Self Care | Payer: Medicare Other | Source: Ambulatory Visit | Attending: Radiation Oncology | Admitting: Radiation Oncology

## 2016-02-23 VITALS — BP 158/57 | HR 71 | Temp 98.1°F

## 2016-02-23 DIAGNOSIS — Z510 Encounter for antineoplastic radiation therapy: Secondary | ICD-10-CM | POA: Diagnosis not present

## 2016-02-23 DIAGNOSIS — G500 Trigeminal neuralgia: Secondary | ICD-10-CM

## 2016-02-23 NOTE — Op Note (Signed)
Name: Sharon Rogers    MRN: [id number]   Date: 02/23/2016    DOB: 1929-04-02   STEREOTACTIC RADIOSURGERY OPERATIVE NOTE  PRE-OPERATIVE DIAGNOSIS:  Right trigeminal neuralgia  POST-OPERATIVE DIAGNOSIS:  Same  PROCEDURE:  Stereotactic Radiosurgery  SURGEON:  Consuella Lose, MD  RADIATION ONCOLOGIST: Dr. Tyler Pita, MD  TECHNIQUE:  The patient underwent a radiation treatment planning session in the radiation oncology simulation suite under the care of the radiation oncology physician and physicist.  I participated closely in the radiation treatment planning afterwards. The patient underwent planning CT which was fused to 3T high resolution MRI with 1 mm axial slices.  These images were fused on the planning system.  We contoured the gross target volumes and subsequently expanded this to yield the Planning Target Volume. I actively participated in the planning process.  I helped to define and review the target contours and also the contours of the optic pathway, eyes, brainstem and selected nearby organs at risk.  All the dose constraints for critical structures were reviewed and compared to AAPM Task Group 101.  The prescription dose conformity was reviewed.  I approved the plan electronically.    Accordingly, [name]  was brought to the TrueBeam stereotactic radiation treatment linac and placed in the custom immobilization mask.  The patient was aligned according to the IR fiducial markers with BrainLab Exactrac, then orthogonal x-rays were used in ExacTrac with the 6DOF robotic table and the shifts were made to align the patient  [name] received stereotactic radiosurgery to a prescription dose of 90Gy at the isocenter uneventfully.    The detailed description of the procedure is recorded in the radiation oncology procedure note.  I was present for the duration of the procedure.  DISPOSITION:   Following delivery, the patient was transported to nursing  in stable condition and monitored for possible acute effects to be discharged to home in stable condition with follow-up in one month.  Consuella Lose, MD [facility]

## 2016-02-23 NOTE — Progress Notes (Addendum)
Patient here for post [facility] monitoring.  Patient reports having slight pain on right side of her mouth.  She was given a mask which relieved the pain.  She also had slight nausea.  Will continue to monitor.

## 2016-02-23 NOTE — Progress Notes (Signed)
Patient discharged accompanied by her daughter.

## 2016-02-23 NOTE — Progress Notes (Signed)
  Radiation Oncology         [phone]) [phone] ________________________________  Stereotactic Treatment Procedure Note  Name: Sharon Rogers MRN: [id number]  Date: 02/23/2016  DOB: May 31, 1929  SPECIAL TREATMENT PROCEDURE    ICD-9-CM ICD-10-CM   1. Trigeminal neuralgia of right side of face 350.1 G50.0     3D TREATMENT PLANNING AND DOSIMETRY:  The patient's radiation plan was reviewed and approved by neurosurgery and radiation oncology prior to treatment.  It showed 3-dimensional radiation distributions overlaid onto the planning CT/MRI image set.  The [facility] for the target structures as well as the organs at risk were reviewed. The documentation of the 3D plan and dosimetry are filed in the radiation oncology EMR.  NARRATIVE:  [name] was brought to the TrueBeam stereotactic radiation treatment machine and placed supine on the CT couch. The head frame was applied, and the patient was set up for stereotactic radiosurgery.  Neurosurgery was present for the set-up and delivery  SIMULATION VERIFICATION:  In the couch zero-angle position, the patient underwent Exactrac imaging using the Brainlab system with orthogonal KV images.  These were carefully aligned and repeated to confirm treatment position for each of the isocenters.  The Exactrac snap film verification was repeated at each couch angle.  PROCEDURE: [name] received stereotactic radiosurgery to the following targets: Right trigeminal nerve root target was treated using 8 Circular Arcs to a prescription dose of 90 Gy.  ExacTrac registration was performed for each couch angle.  The treatment was prescribed to isocenter.  6 MV X-rays were delivered in the flattening filter free beam mode.  The 4 mm circular collimator was used for all fields.  STEREOTACTIC TREATMENT MANAGEMENT:  Following delivery, the patient was transported to nursing in stable condition and monitored for possible acute effects.  Vital signs were  recorded BP (!) 158/57   Pulse 71   Temp 98.1 F (36.7 C) (Oral)   SpO2 95% . The patient tolerated treatment without significant acute effects, and was discharged to home in stable condition.    PLAN: Follow-up in one month.  ________________________________  Sheral Apley. Tammi Klippel, M.D.

## 2016-02-29 NOTE — Progress Notes (Signed)
  Radiation Oncology         [phone] ________________________________  Name: Lakindra Manikowski MRN: [id number]  Date: 02/23/2016  DOB: [birth year]  End of Treatment Note  Diagnosis:  82 year-old woman with right-sided trigeminal neuralgia      Indication for treatment:  Curative       Radiation treatment dates:   02/23/2016  Site/dose:  The PTV1 Right Trigeminal Nerve Root was treated to 90 Gy in 1 fraction.   Beams/energy:   SBRT/SRT-3D // 6FFF  Narrative: The patient tolerated radiation treatment relatively well.  The patient experienced no adverse side effects from [facility] treatment.   Plan: The patient has completed radiation treatment. The patient will return to radiation oncology clinic for routine followup in one month. I advised her to call or return sooner if she has any questions or concerns related to her recovery or treatment. ________________________________  Sheral Apley. Tammi Klippel, M.D.  This document serves as a record of services personally performed by Tyler Pita, MD. It was created on his behalf by Arlyce Harman, a trained medical scribe. The creation of this record is based on the scribe's personal observations and the provider's statements to them. This document has been checked and approved by the attending provider.

## 2016-04-02 ENCOUNTER — Ambulatory Visit
Admission: RE | Admit: 2016-04-02 | Discharge: 2016-04-02 | Disposition: A | Discharge status: Home / Self Care | Payer: Medicare Other | Source: Ambulatory Visit | Attending: Radiation Oncology | Admitting: Radiation Oncology

## 2016-04-05 ENCOUNTER — Encounter: Payer: Self-pay | Admitting: Radiation Oncology

## 2016-04-05 ENCOUNTER — Ambulatory Visit
Admission: RE | Admit: 2016-04-05 | Discharge: 2016-04-05 | Disposition: A | Discharge status: Home / Self Care | Payer: Medicare Other | Source: Ambulatory Visit | Attending: Radiation Oncology | Admitting: Radiation Oncology

## 2016-04-05 VITALS — BP 132/52 | HR 81 | Temp 98.4°F | Resp 18 | Ht 65.0 in | Wt 132.2 lb

## 2016-04-05 DIAGNOSIS — Z5189 Encounter for other specified aftercare: Secondary | ICD-10-CM | POA: Diagnosis not present

## 2016-04-05 DIAGNOSIS — Z79899 Other long term (current) drug therapy: Secondary | ICD-10-CM | POA: Insufficient documentation

## 2016-04-05 DIAGNOSIS — G500 Trigeminal neuralgia: Secondary | ICD-10-CM | POA: Diagnosis not present

## 2016-04-05 DIAGNOSIS — Z923 Personal history of irradiation: Secondary | ICD-10-CM | POA: Insufficient documentation

## 2016-04-05 DIAGNOSIS — Z880 Allergy status to penicillin: Secondary | ICD-10-CM | POA: Diagnosis not present

## 2016-04-05 NOTE — Progress Notes (Signed)
Sharon Rogers 83 year-old woman with right-sided trigeminal neuralgia one month S/P SRS right trigeminal nerve root 02-23-16 follow up appointment.  Pain:4/10 coccyx fell Sunday night, legs are weak Nausea:yes  taking Zofran Appetite: Fair Fatigue:Having fatigue sleeping a lot 10-12 hours a day. Cognitive changes: None Wt Readings from Last 3 Encounters:  04/05/16 132 lb 3.2 oz (60 kg)  02/06/16 132 lb 3.2 oz (60 kg)  12/06/15 140 lb (63.5 kg)  BP (!) 132/52   Pulse 81   Temp 98.4 F (36.9 C) (Oral)   Resp 18   Ht 5\' 5"  (1.651 m)   Wt 132 lb 3.2 oz (60 kg)   SpO2 95%   BMI 22.00 kg/m

## 2016-04-08 ENCOUNTER — Other Ambulatory Visit: Payer: Self-pay | Admitting: Radiation Oncology

## 2016-04-08 DIAGNOSIS — G500 Trigeminal neuralgia: Secondary | ICD-10-CM

## 2016-04-08 NOTE — Progress Notes (Signed)
Radiation Oncology         [phone]) [phone] ________________________________  Name: Sharon Rogers MRN: [id number]  Date: 04/05/2016  DOB: 05-Jun-1929  Post Treatment Note  CC: Deloria Lair, MD  Consuella Lose, MD  Diagnosis:   Trigeminal neuralgia.   Interval Since Last Radiation:  6 weeks   02/23/2016: The PTV1 Right Trigeminal Nerve Root was treated to 90 Gy in 1 fraction.   Narrative:  The patient returns today for routine follow-up. She tolerated her radiotherapy well to the trigeminal nerve root.                            On review of systems, the patient states she is feeling very well since treatment, and reports her pain has lessened in frequency, intensity, and duration. She describes a few seconds intermittently of a sharp pain in her right cheek and face. This subsides without intervention in seconds. She denies any changes in her skin, dry mouth, trouble chewing or eating. She denies any taste disturbances. No other complaints are verbalized.  ALLERGIES:  is allergic to penicillins; phenergan [promethazine hcl]; and sulfa antibiotics.  Meds: Current Outpatient Prescriptions  Medication Sig Dispense Refill  . acetaminophen (TYLENOL) 500 MG tablet Take 500 mg by mouth every Thursday.    . folic acid (FOLVITE) 1 MG tablet Take 1 mg by mouth daily.    . furosemide (LASIX) 20 MG tablet Take 20 mg by mouth.    . Lactobacillus (PROBIOTIC ACIDOPHILUS PO) Take by mouth.    . ondansetron (ZOFRAN) 4 MG tablet TK 1 T PO Q 8 H PRF NAUSEA  2  . Oxcarbazepine (TRILEPTAL) 300 MG tablet TAKE 1 TABLET(300 MG) BY MOUTH TWICE DAILY 60 tablet 5  . Probiotic Product ([place]) Take by mouth.    . ranitidine (ZANTAC) 150 MG capsule Take 75 mg by mouth 2 (two) times daily.     . sertraline (ZOLOFT) 100 MG tablet Take 100 mg by mouth daily.     No current facility-administered medications for this encounter.     Physical Findings:  height is 5\' 5"  (1.651 m) and  weight is 132 lb 3.2 oz (60 kg). Her oral temperature is 98.4 F (36.9 C). Her blood pressure is 132/52 (abnormal) and her pulse is 81. Her respiration is 18 and oxygen saturation is 95%.  In general this is a well appearing Caucasian female in no acute distress. She's alert and oriented x4 and appropriate throughout the examination. Cardiopulmonary assessment is negative for acute distress and she exhibits normal effort. The skin along her right cheek and zygomatic process are intact without desquamation or hair loss.   Lab Findings: Lab Results  Component Value Date   WBC 5.3 07/27/2015   HCT 40.5 07/27/2015   MCV 101 (H) 07/27/2015   PLT 171 07/27/2015     Radiographic Findings: No results found.  Impression/Plan: 1. Trigeminal neuralgia. I spoke with the patient today and discussed that we would like to see her back in about 6 months' time for continued evaluation of this condition. She questions whether or not she needs to continue Trileptal which was started by Dr. Rexene Alberts. I will discuss with Dr. Tammi Klippel, and if he feels this requires evaluation with neurology we will re-refer her back to Dr. Maxie Barb for this. I will also discuss her case with Roma Kayser in genetics due to her sister having recently been diagnosed with this condition, as  well as her father apparently had similar symptoms consistent with trigeminal neuralgia.      Carola Rhine, PAC

## 2016-04-09 ENCOUNTER — Telehealth: Payer: Self-pay | Admitting: Radiation Oncology

## 2016-04-11 NOTE — Telephone Encounter (Signed)
I spoke with the patient's daughter to discuss that I've spoken with genetics and there are no known tests to offer at this time. We also discussed following up with Dr. Maxie Barb for guidelines on if she should discontinue the Trileptal and if so how to stop this medication. We will see the patient in 6 months time for follow up.

## 2016-04-23 ENCOUNTER — Telehealth: Payer: Self-pay

## 2016-04-23 NOTE — Telephone Encounter (Signed)
Pt's daughter came in today w/ her dad to see Dr. Jannifer Franklin. Has questions about oxcarbazepine. Pt had radiation treatment w/ Dr. Tammi Klippel and Dr. Elenor Legato. They feel that the procedure was successful. Would like Dr. Guadelupe Sabin advice on titrating off of medication. Please call daughter, Nira Conn @ [phone] w/ med instructions.

## 2016-04-23 NOTE — Telephone Encounter (Signed)
Patient can try to taper off the Trileptal by take 1 pill once daily for 1 week, then 1 pill once every other day for one week then stop. Please call daughter back to advise.

## 2016-04-24 ENCOUNTER — Telehealth: Payer: Self-pay | Admitting: Neurology

## 2016-04-24 NOTE — Telephone Encounter (Signed)
Nira Conn the daughter is returning your call

## 2016-04-24 NOTE — Telephone Encounter (Signed)
I called daughter back and gave advice below. She voiced understanding. She will call back if any further questions.

## 2016-10-11 ENCOUNTER — Ambulatory Visit
Admission: RE | Admit: 2016-10-11 | Discharge: 2016-10-11 | Disposition: A | Discharge status: Home / Self Care | Payer: Medicare Other | Source: Ambulatory Visit | Attending: Radiation Oncology | Admitting: Radiation Oncology

## 2016-11-01 IMAGING — MR MR HEAD WO/W CM
8 of 9 series · 40 of 48 positions shown · IV contrast (multihance)
Comparison: None.

CLINICAL DATA: 86 y/o F; trigeminal protocol for trigeminal
neuralgia. SRS patient.

Creatinine was obtained on site at [HOSPITAL] at [HOSPITAL].
Results: Creatinine 1.0 mg/dL.
EXAM:
MRI HEAD WITHOUT AND WITH CONTRAST
TECHNIQUE: Multiplanar, multiecho pulse sequences of the brain and surrounding
structures were obtained without and with intravenous contrast.
Trigeminal nerve protocol.
CONTRAST:  13mL MULTIHANCE GADOBENATE DIMEGLUMINE 529 MG/ML IV SOLN

[Series 10: T1 · sagittal · 4.0mm · 0.70mm/px · 4 of 30 slices shown (1 of 3)]
[im 1/30]
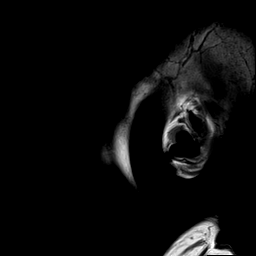
[im 10/30]
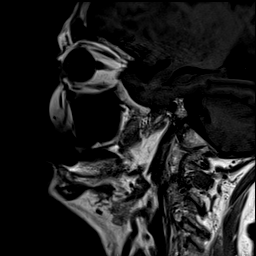
[im 20/30]
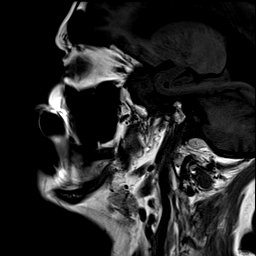
[im 30/30]
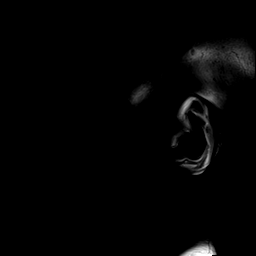

[Series 11: T2 · coronal · 3.0mm · 0.56mm/px · 4 of 39 slices shown]
[im 1/39]
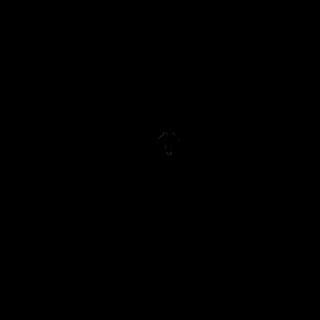
[im 13/39]
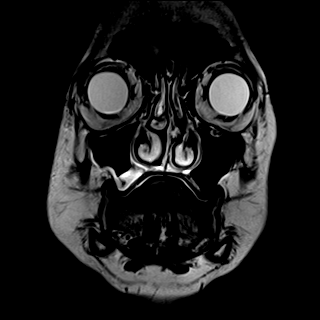
[im 26/39]
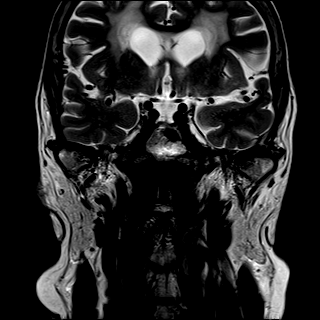
[im 39/39]
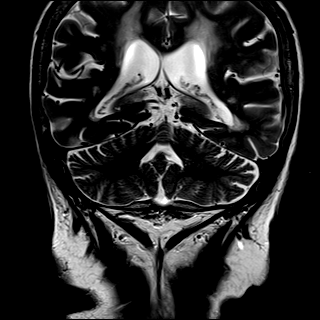

[Series 15: T1 · axial · 3.0mm · 0.31mm/px · z∈[-146,-16]mm · 4 of 41 slices shown (2 of 3)]
[im 1/41]
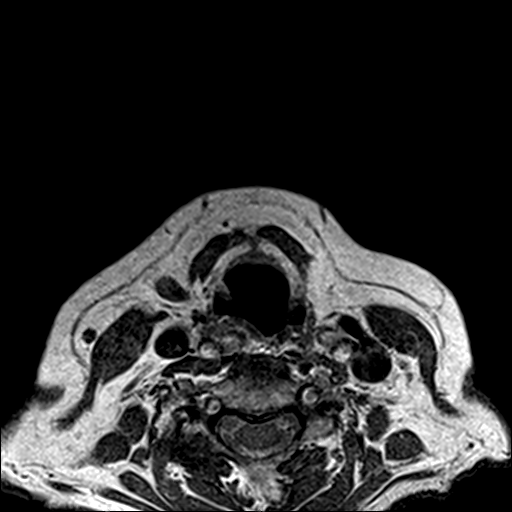
[im 14/41]
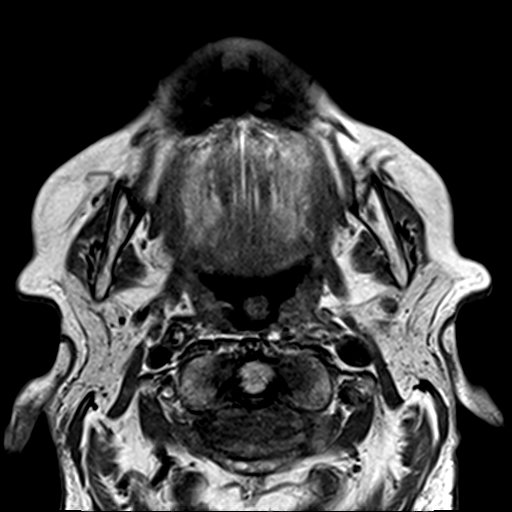
[im 27/41]
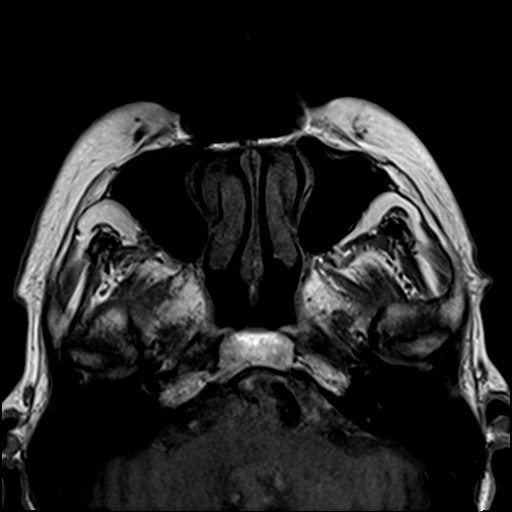
[im 41/41]
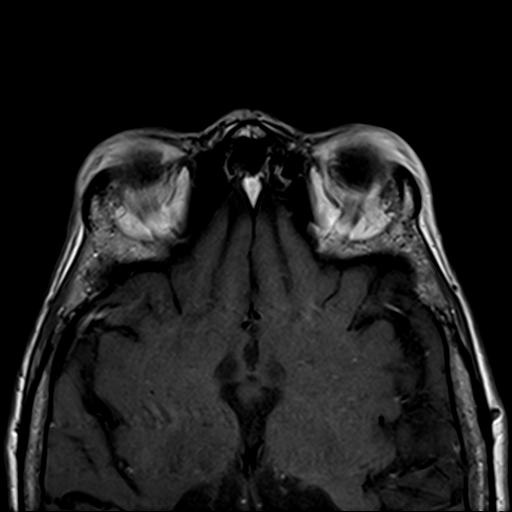

[Series 16: STIR · axial · 3.0mm · 0.62mm/px · z∈[-148,-5]mm · 4 of 38 slices shown]
[im 1/38]
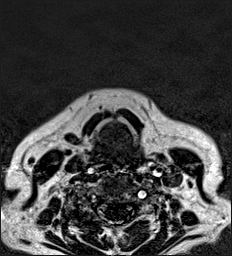
[im 13/38]
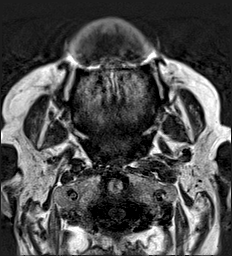
[im 25/38]
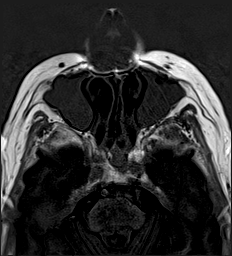
[im 38/38]
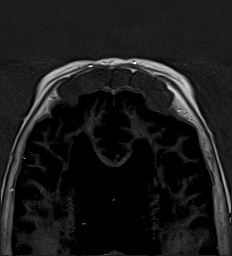

[Series 17: T1 · coronal · 3.0mm · 0.70mm/px · 4 of 39 slices shown (3 of 3)]
[im 1/39]
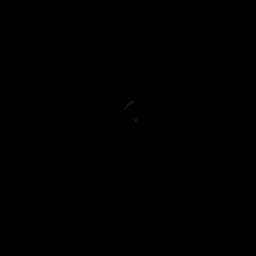
[im 13/39]
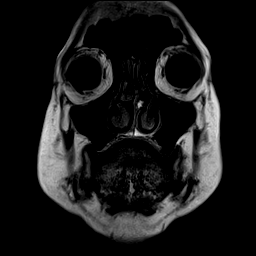
[im 26/39]
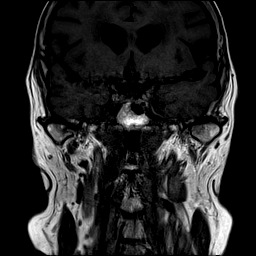
[im 39/39]
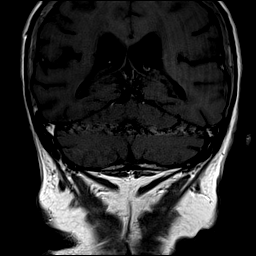

[Series 19: T1 post-contrast · coronal · 3.0mm · 0.70mm/px · 4 of 39 slices shown (1 of 2)]
[im 1/39]
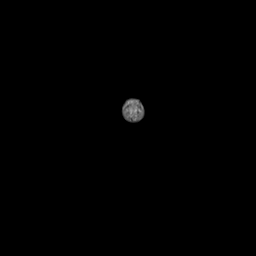
[im 13/39]
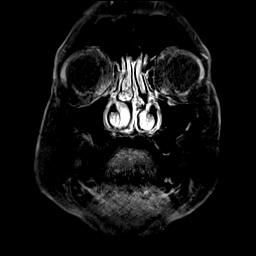
[im 26/39]
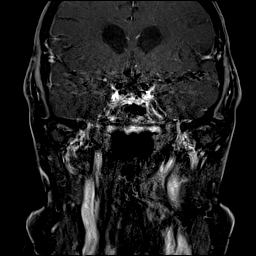
[im 39/39]
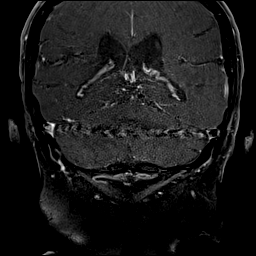

[Series 20: T1 fat-sat post-contrast · axial · 3.0mm · 0.31mm/px · z∈[-146,-16]mm · 4 of 41 slices shown]
[im 1/41]
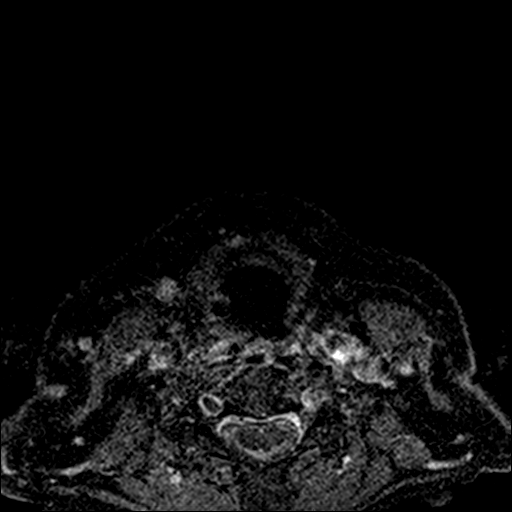
[im 14/41]
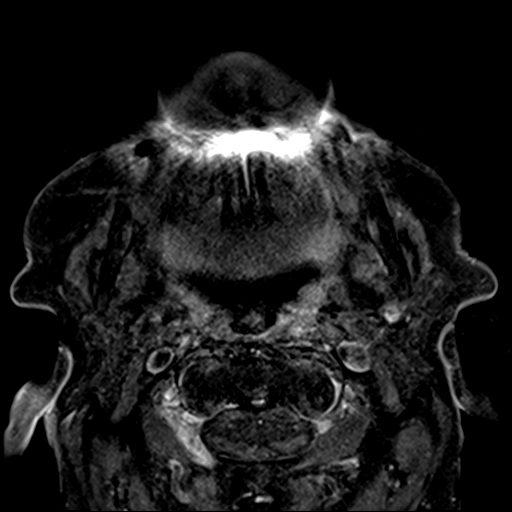
[im 27/41]
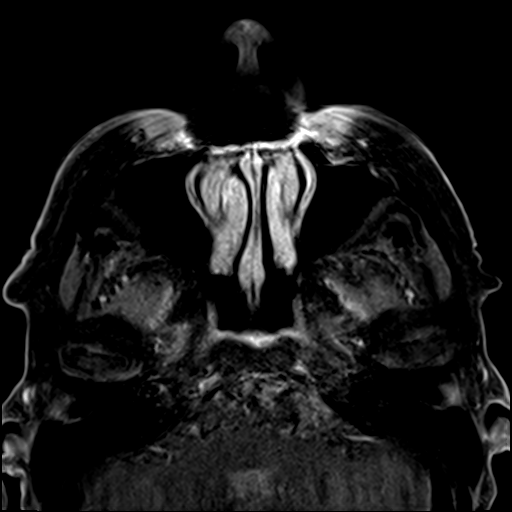
[im 41/41]
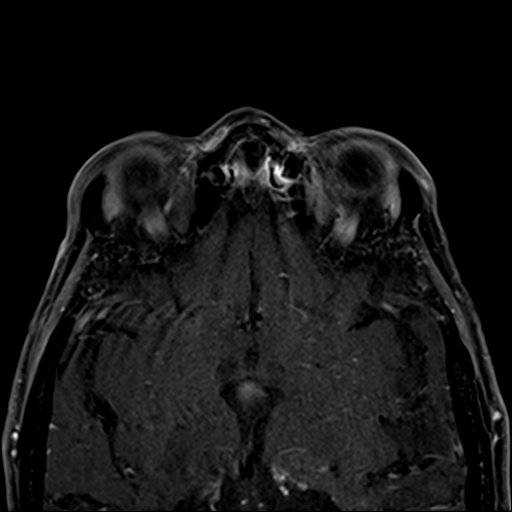

[Series 21: T1 post-contrast · axial · 1.0mm · 0.75mm/px · z∈[-74,+85]mm · 12 of 160 slices shown (2 of 2)]
[im 1/160]
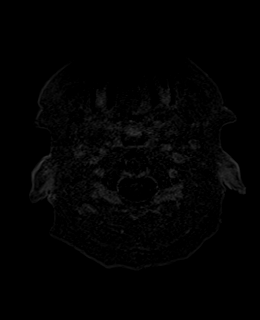
[im 11/160]
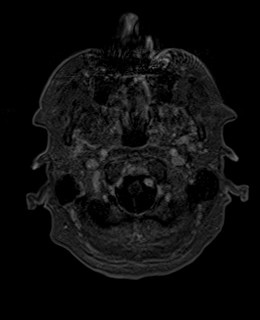
[im 22/160]
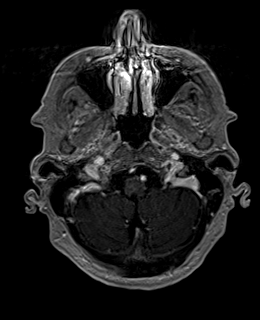
[im 32/160]
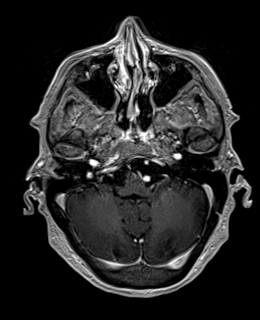
[im 43/160]
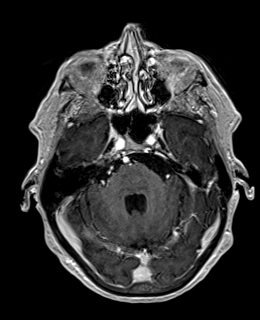
[im 54/160]
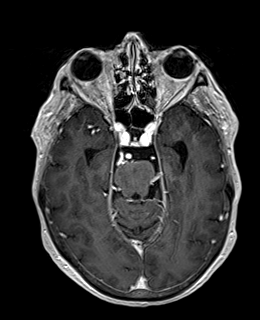
[im 75/160]
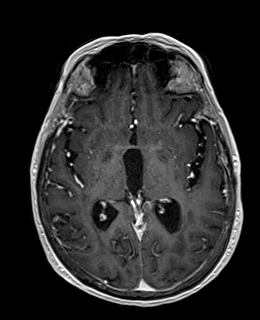
[im 85/160]
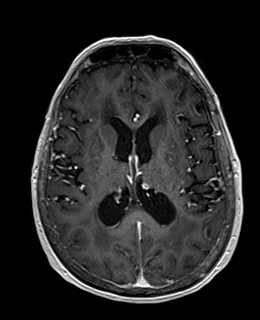
[im 96/160]
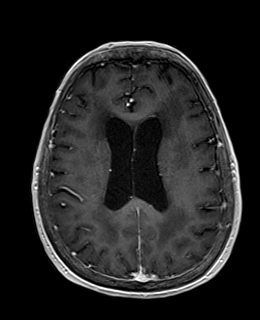
[im 117/160]
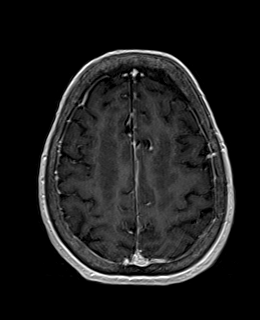
[im 138/160]
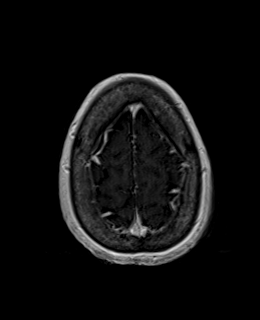
[im 160/160]
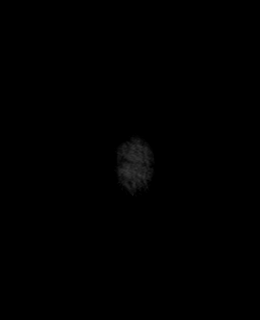

[40 of 48 positions shown; findings below may reference images not displayed]

FINDINGS: Brain: No abnormal enhancement of the brain parenchyma. No
extra-axial enhancement. Confluent T2 FLAIR hyperintense signal
abnormality in periventricular white matter is consistent with
moderate chronic microvascular ischemic changes for age. Mild
parenchymal volume loss. Several T2 hyperintense foci within the
bilateral basal ganglia probably represent a combination of old
chronic lacunar infarcts and prominent perivascular spaces.

Bilateral trigeminal nerve complexes are intact. There is no
abnormal enhancement. Internal auditory canals are normal in signal
without mass or abnormal enhancement. Inner ear structures are
morphologically normal

Vascular: Normal flow voids and enhancement of the circle of Willis.
There is neurovascular contact upon the right trigeminal nerve
cisternal segment by the right ICA with mild lateral displacement of
the nerve (series 13, image 22).

Skull and upper cervical spine: No abnormal enhancement of the
calvarium.

Sinuses/Orbits: Mild ethmoid sinus mucosal thickening. Mucous
retention cyst/ polyp measuring 10 mm within the left posterior
ethmoid air cells. No abnormal signal of mastoid air cells.

Other: None.
IMPRESSION: 1. No mass or abnormal enhancement of the trigeminal nerve
complexes.
2. Neurovascular contact upon the right cisternal trigeminal nerve
by the right SCA with mild lateral displacement of the nerve.
3. Moderate chronic microvascular ischemic changes and mild
parenchymal volume loss of the brain for age. No abnormal
enhancement.

By: Auntyjatty Delowr M.D.

## 2016-12-17 ENCOUNTER — Other Ambulatory Visit: Payer: Self-pay | Admitting: Radiation Therapy

## 2016-12-17 DIAGNOSIS — G500 Trigeminal neuralgia: Secondary | ICD-10-CM

## 2016-12-19 ENCOUNTER — Other Ambulatory Visit: Payer: Medicare Other

## 2016-12-19 ENCOUNTER — Ambulatory Visit: Payer: Medicare Other | Admitting: Urology

## 2016-12-19 ENCOUNTER — Ambulatory Visit: Payer: Medicare Other

## 2016-12-25 ENCOUNTER — Telehealth: Payer: Self-pay | Admitting: Radiation Therapy

## 2016-12-25 NOTE — Telephone Encounter (Signed)
Sharon Rogers's daughter called to say that her mother has noticed an improvement in her facial pain and would like to cancel her planned retreatment for now. Sharon Rogers was recently in the hospital with a separate issue, difficulty breathing. Since she has other things going on, and will likely be discharged with oxygen, Sharon Rogers is not interested in moving forward with anything more at this time. If she changes her mind, they will let us know.      I have cancelled her appointments and passed this info on to her treatment team.   Mont Dutton R.T.(R)(T) Special Procedures Navigator

## 2016-12-26 ENCOUNTER — Other Ambulatory Visit: Payer: Medicare Other

## 2016-12-27 ENCOUNTER — Ambulatory Visit: Admission: RE | Admit: 2016-12-27 | Payer: Medicare Other | Source: Ambulatory Visit | Admitting: Urology

## 2016-12-27 ENCOUNTER — Ambulatory Visit: Payer: Medicare Other

## 2017-01-02 ENCOUNTER — Ambulatory Visit: Payer: Medicare Other

## 2017-01-02 ENCOUNTER — Ambulatory Visit: Payer: Medicare Other | Admitting: Radiation Oncology

## 2017-01-11 ENCOUNTER — Ambulatory Visit: Payer: Medicare Other | Admitting: Radiation Oncology

## 2017-06-29 ENCOUNTER — Other Ambulatory Visit: Payer: Self-pay | Admitting: Neurology

## 2017-06-29 DIAGNOSIS — G500 Trigeminal neuralgia: Secondary | ICD-10-CM

## 2019-08-13 ENCOUNTER — Ambulatory Visit (INDEPENDENT_AMBULATORY_CARE_PROVIDER_SITE_OTHER): Payer: Medicare Other | Admitting: Neurology

## 2019-08-13 ENCOUNTER — Encounter: Payer: Self-pay | Admitting: Neurology

## 2019-08-13 ENCOUNTER — Other Ambulatory Visit: Payer: Self-pay

## 2019-08-13 VITALS — BP 121/78 | HR 87 | Wt 127.0 lb

## 2019-08-13 DIAGNOSIS — H5121 Internuclear ophthalmoplegia, right eye: Secondary | ICD-10-CM

## 2019-08-13 NOTE — Patient Instructions (Signed)
I am sorry to hear that you had trouble with the double vision.  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  Often, these types of strokes do not show up on a CAT scan and it is a good idea to proceed with a brain MRI.  Please let me know the findings of your brain MRI, please have your primary care PA send a copy to our office.  You have had all the other additional work-up including seeing a cardiologist, being on a blood thinner, cholesterol management.

## 2019-08-13 NOTE — Progress Notes (Signed)
Subjective:    Patient ID: Sharon Rogers is a 86 y.o. female.  HPI     Star Age, MD, PhD [facility] [address], [address] [place], [place] [address]  Dear Dr. Barbra Sarks,  I saw your patient, Sharon Rogers, upon your kind request of her neurologic clinic today for initial consultation of her double vision.  The patient is accompanied by her daughter today.  As you know, [name] is an 86 year old right-handed woman with an underlying complex medical history of hyperlipidemia, kidney cancer, arthritis, COPD, oxygen dependence, allergies, trigeminal neuralgia, depression, anxiety, status post multiple surgeries including hemicolectomy, back surgery, cholecystectomy, left kidney removal, who reports recent onset of double vision since late April. It happened around 07/21/2019 and fairly suddenly.  In fact, patient recalls that she went to the bathroom and suddenly started seeing double.  She called for help.  She had a head CT with [facility] which was negative for a stroke, imaging report was not available for my review today.  She has been seen by her primary care physician, also had a cardiac consultation and had a carotid Doppler study and echocardiogram.  She does have a history of congestive heart failure and lower extremity swelling recently had increase in her lower extremity edema.  She was started on losartan per daughter.  The losartan was started shortly before she had the eye symptomatology.  She denies any headache, denies any sudden onset of one-sided weakness or numbness or tingling or droopy face or slurring of speech.  She is followed for her rheumatoid arthritis by rheumatology.  She is scheduled tomorrow for a brain MRI. I reviewed your office note from 07/28/2019.  She was noted to have right eye exotropia.  She was provided with an eye patch to alleviate her double vision symptomatically. I have previously seen her for trigeminal  neuralgia.    Previously:    12/06/15: 86 year old right-handed woman with an underlying complex medical history of colon cancer, kidney cancer, status post nephrectomy, COPD, vitamin B12 deficiency, osteoporosis, hyperlipidemia, psoriatic arthritis, anxiety, depression, vertigo and trigeminal neuralgia, who presents for follow-up consultation of her trigeminal neuralgia. The patient is accompanied by her daughter again today. I last saw her on 07/27/2015 at which time she reported that the Trileptal it helped a little bit. She was taking 150 mg twice daily and tolerating it. She was trying to eat better and drink enough water. She was also taking Tylenol and Advil, no longer hydrocodone. She only has one kidney. She was told in the past not to take any Aleve on a daily basis. I suggested we increase her Trileptal to 300 mg twice daily. We checked some blood work, we called her with results. I asked her to have her B12 level checked with her primary care physician. Of note, she reported a recent fall. She was squatting down and fell backwards and bumped her head. She told her daughter a few days later. She had an MRA head without contrast at [facility] on 05/03/2015. I reviewed the images on CD. 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 I reviewed the brain MRA report from [facility]: Impression: Ectatic vertebral arteries and basilar artery do not cause impression upon the cisternal aspect of the fifth cranial nerve. Right superior cerebellar artery approaches the cisternal aspect of the right fifth cranial nerve. I referred her to Dr. Kathyrn Sheriff, she saw him in June, discussed all the options and has decided to  proceed with gamma knife, but is still waiting To hear back from his office. Her daughter has called multiple times from what I understand.   Today, 12/06/2015: She reports unchanged symptoms, the increase in Trileptal has not helped, no new issues, no falls recently,  chewing does not make her symptoms worse necessarily. In the interim, developed lower extremity swelling, was diagnosed with heart failure and was placed on Lasix which she now takes once a week or so as needed, swelling has improved.   I saw her on 04/27/2015, at which time she reported a long-standing history of trigeminal neuralgia, for which she was followed by Dr. Francesco Runner for years, up until recently. She had tried multiple medications in the past including Tegretol immediate release, gabapentin, Lyrica, hydrocodone. She had a fall in October 2016, striking her head on the small table. She sustained bruises to her arm and right-sided chest. She was on sertraline for her mood disorder. She had stopped driving. Her sertraline was 100 mg once daily. She had issues with tolerance of Tegretol including nausea and vomiting. Lyrica did not help. She had also seen a nurse practitioner, Ms. San Jetty, at [facility], who had prescribed the Lyrica. She was using hydrocodone 5-325 mg as needed. Her symptoms date back to 2008 she started having right-sided neuralgic pain and numbness in the right midface. She reported daily attacks triggered by chewing or eating. She would have to skip meals because of fear of exacerbation of her pain. She also was not always drinking enough water. Of note, she only has one kidney. I suggested we switch her to Tegretol-XR twice daily. In the interim, in early April 2017 her daughter called back with flareup of the pain which was not responding to the long-acting Tegretol or hydrocodone. I suggested we tapered her off of the Tegretol-XR and started titration for Trileptal.      I saw her on 04/23/2013, at which time she reported feeling improved with her respect to her vertigo. She had some changes in her medications and was on a lower dose of Tegretol, was off of nortriptyline and off of Zoloft. She was on Cymbalta twice daily. She had finished physical therapy  and felt that it was helpful for her vertigo. She was advised to follow-up as needed.    Of note, she went to the emergency room at [facility] on 04/24/2015. She was diagnosed with dehydration and urinary tract infection. She was prescribed Omnicef for 5 days and Zofran for nausea. I reviewed the hospital records she brought in today. Urinalysis showed cloudy appearance urine nitrite positive, leukocyte esterase moderately high, CMP showed BUN of 21, creatinine 1.15, otherwise fairly unremarkable findings.   I first met her on 10/07/2012, and which time I felt she had BPPV. She had a nonfocal exam at the time. I asked her to stay well-hydrated, change positions slowly and suggested that she consider coming off of the Pamelor as it can contribute to sedation and balance issues. I suggested physical therapy for vestibular rehabilitation and she wanted to do this locally. I also ordered a brain MRI with and without contrast. She brain MRI with and without contrast at [facility] on 10/14/2012 and I reviewed the report on 10/15/2012: Impression: no acute infarct, prominent small vessel disease type changes, global atrophy, no intracranial mass or abnormal enhancement, mild paranasal sinus mucosal thickening most notable on the right.   At the time of her first visit she reported feeling off balance off  and on for years. She had had some falls. Symptoms became worse in the last 6-7 months prior to her first visit. She had been on Tegretol for trigeminal neuralgia but stopped taking it in the morning because of drowsiness. She is status post right hemicolectomy in 2004, cholecystectomy in 2004, left kidney removal in 2005 and has a remote history of back surgery in 1969.   She sees Dr. Francesco Runner for pain management, in particular her TGN.    Her Past Medical History Is Significant For: Past Medical History:  Diagnosis Date  . Anxiety   . Arthritis   . B12 deficiency   . Benign  paroxysmal positional vertigo 10/07/2012  . BPPV (benign paroxysmal positional vertigo)   . Cancer [facility])    kidney cancer  . Colon cancer ([place])   . COPD (chronic obstructive pulmonary disease) ([place])   . Depression   . Heart failure ([place])   . Hyperlipemia   . Kidney cancer, primary, with metastasis from kidney to other site [facility])   . Osteoporosis   . Trigeminal neuralgia   . Trigeminal neuralgia     Her Past Surgical History Is Significant For: Past Surgical History:  Procedure Laterality Date  . CESAREAN SECTION  [phone]  . CHOLECYSTECTOMY  2004  . CYST EXCISION  1978   Base of spine  . HEMICOLECTOMY Right 2004  . KIDNEY SURGERY Left 2005   removed    Her Family History Is Significant For: Family History  Problem Relation Age of Onset  . Stroke Father   . Cancer Sister        Breast  . Cancer Sister        Breast  . Cancer Sister        Breast    Her Social History Is Significant For: Social History   Socioeconomic History  . Marital status: Married    Spouse name: Not on file  . Number of children: 3  . Years of education: HS  . Highest education level: Not on file  Occupational History  . Occupation: Retired  Tobacco Use  . Smoking status: Former Smoker    Packs/day: 0.50    Years: 30.00    Pack years: 15.00    Types: Cigarettes    Quit date: 10/08/1979    Years since quitting: 39.8  . Smokeless tobacco: Never Used  Substance and Sexual Activity  . Alcohol use: No    Alcohol/week: 0.0 standard drinks  . Drug use: No  . Sexual activity: Never  Other Topics Concern  . Not on file  Social History Narrative   Patient drinks 1 coke a day    Social Determinants of Health   Financial Resource Strain:   . Difficulty of Paying Living Expenses:   Food Insecurity:   . Worried About Charity fundraiser in the Last Year:   . Arboriculturist in the Last Year:   Transportation Needs:   . Film/video editor (Medical):   Marland Kitchen Lack of Transportation  (Non-Medical):   Physical Activity:   . Days of Exercise per Week:   . Minutes of Exercise per Session:   Stress:   . Feeling of Stress :   Social Connections:   . Frequency of Communication with Friends and Family:   . Frequency of Social Gatherings with Friends and Family:   . Attends Religious Services:   . Active Member of Clubs or Organizations:   . Attends Archivist Meetings:   .  Marital Status:     Her Allergies Are:  Allergies  Allergen Reactions  . Penicillins Itching    Blisters  . Phenergan [Promethazine Hcl] Other (See Comments)    Psychological   . Sulfa Antibiotics Rash  :   Her Current Medications Are:  Outpatient Encounter Medications as of 08/13/2019  Medication Sig  . ARIPiprazole (ABILIFY) 2 MG tablet Take 2 mg by mouth daily.  . cetirizine (ZYRTEC) 10 MG tablet Take 10 mg by mouth daily.  . clopidogrel (PLAVIX) 75 MG tablet Take 75 mg by mouth daily.  . furosemide (LASIX) 20 MG tablet Take 20 mg by mouth.  . losartan (COZAAR) 50 MG tablet Take 50 mg by mouth daily.  . montelukast (SINGULAIR) 10 MG tablet Take 10 mg by mouth at bedtime.  Marland Kitchen omeprazole (PRILOSEC) 10 MG capsule Take 40 mg by mouth daily.  . ondansetron (ZOFRAN) 4 MG tablet TK 1 T PO Q 8 H PRF NAUSEA  . Oxcarbazepine (TRILEPTAL) 300 MG tablet TAKE 1 TABLET(300 MG) BY MOUTH TWICE DAILY  . POTASSIUM PO Take by mouth.  . sertraline (ZOLOFT) 100 MG tablet Take 100 mg by mouth daily.  . traMADol (ULTRAM) 50 MG tablet Take by mouth every 6 (six) hours as needed.  . [DISCONTINUED] acetaminophen (TYLENOL) 500 MG tablet Take 500 mg by mouth every Thursday.  . [DISCONTINUED] folic acid (FOLVITE) 1 MG tablet Take 1 mg by mouth daily.  . [DISCONTINUED] Lactobacillus (PROBIOTIC ACIDOPHILUS PO) Take by mouth.  . [DISCONTINUED] Probiotic Product ([place]) Take by mouth.  . [DISCONTINUED] ranitidine (ZANTAC) 150 MG capsule Take 75 mg by mouth 2 (two) times daily.    No  facility-administered encounter medications on file as of 08/13/2019.  :   Review of Systems:  Out of a complete 14 point review of systems, all are reviewed and negative with the exception of these symptoms as listed below: Review of Systems  Neurological:       Here for consult on double vision in right eye. PCP question right eye stroke.     Objective:  Neurological Exam  Physical Exam Physical Examination:   Vitals:   08/13/19 1500  BP: 121/78  Pulse: 87    General Examination: The patient is a very pleasant 86 y.o. female in no acute distress. She appears well-developed and well-nourished and well groomed.   HEENT: Normocephalic, atraumatic, pupils are equal, round and reactive to light. She has fairly dense cataracts, funduscopic exam is difficult.  Extraocular movements are impaired and then her right eye does not cross the midline upon gaze to the left but she has fairly conjugate movements to the right with both eyes, no nystagmus noted with lateral gaze to the left of the left eye.  She has significant double vision with gaze to the left, she has a patch over the right eyeglass and removed her eyeglasses briefly for the eye examination, no Horner syndrome.  She has no facial weakness, no dysarthria.  She has oxygen via nasal cannula.  She has normal facial sensation to touch.  Airway examination reveals mild mouth dryness, tongue protrudes centrally in palate elevates symmetrically.  No obvious tongue deviation noted.  Chest: Clear to auscultation without wheezing, rhonchi or crackles noted.  Heart: S1+S2+0, regular and normal without murmurs, rubs or gallops noted.   Abdomen: Soft, non-tender and non-distended with normal bowel sounds appreciated on auscultation.  Extremities: There is 2+ pitting edema in the distal lower extremities bilaterally.   Skin:  Warm and dry without trophic changes noted. There are no varicose veins.  Musculoskeletal: exam reveals  prominent arthritic changes in both hands, with ulnar deviation noted.    Neurologically:  Mental status: The patient is awake, alert and oriented in all 4 spheres. Her memory, attention, language and knowledge are appropriate. There is no aphasia, agnosia, apraxia or anomia. Speech is clear with normal prosody and enunciation. Thought process is linear. Mood is congruent and affect is normal.  Cranial nerves are as described above under HEENT exam. In addition, shoulder shrug is normal with equal shoulder height noted. Motor exam: Thin bulk, normal tone, no resting tremor, global strength of about 4+ out of 5, hand grip strength a little weaker on both sides, no focal abnormality noted.  Romberg is not tested for safety reasons.  She is in the wheelchair, reflexes are 1+ in the upper extremities, trace in the knees and absent in the ankles, sensory exam is intact to light touch.   No dysmetria or intention tremor.  I did not have her stand or walk for me for safety reasons.  Assessment and Plan:   In summary, Geniva Lohnes is a very pleasant 86 year old female with an underlying complex medical history of colon cancer, kidney cancer, status post nephrectomy, COPD, with oxygen dependence, vitamin B12 deficiency, osteoporosis, hyperlipidemia, psoriatic arthritis, anxiety, depression, vertigo, and trigeminal neuralgia, who presents for evaluation of her sudden onset of double vision which started on or around 07/21/2019.  She had a head CT and work-up through cardiology with an echocardiogram and carotid Doppler testing.  Of note, she was started on Plavix by her primary care PA.  She has never had hyperlipidemia.  She was recently started on losartan by primary care as well.  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  I am not quite sure if she has a 3rd cranial nerve palsy on the right.  A brainstem stroke may be the  cause.  She is scheduled for a brain MRI tomorrow.  Given that she only has 1 kidney I am assuming that the MRI is going to be without contrast.  Her daughter will keep me posted as to the MRI results and we will take it from there.  Unfortunately, at this very moment there is not much else to do, she is wearing her eye patch over the right eye to not have double vision all the time.  She has an appointment with you next month.  I answered all their questions today and the patient and her daughter were in agreement with the plan, we did talk about secondary stroke prevention today. Thank you very much for allowing me to participate in the care of this nice patient. If I can be of any further assistance to you please do not hesitate to call me at [phone].  Sincerely,   Star Age, MD, PhD

## 2019-08-17 ENCOUNTER — Telehealth: Payer: Self-pay | Admitting: Neurology

## 2019-08-17 NOTE — Telephone Encounter (Signed)
Pt's daughter Esau Grew. On DPR called wanting to discuss the MRI with the RN. She states that the report was faxed to the office but she is wanting to know if the CD is needed as well. Please advise.

## 2019-08-17 NOTE — Telephone Encounter (Signed)
I have given written report to Dr. Rexene Alberts to review. Once Dr. Rexene Alberts reviews she will let me know if the CD is needed and I will call the pt.

## 2019-08-25 ENCOUNTER — Telehealth: Payer: Self-pay | Admitting: Neurology

## 2019-08-25 DIAGNOSIS — H532 Diplopia: Secondary | ICD-10-CM

## 2019-08-25 DIAGNOSIS — H519 Unspecified disorder of binocular movement: Secondary | ICD-10-CM

## 2019-08-25 DIAGNOSIS — I6381 Other cerebral infarction due to occlusion or stenosis of small artery: Secondary | ICD-10-CM

## 2019-08-25 NOTE — Telephone Encounter (Signed)
I called pt. No answer, left a message asking pt to call me back.   

## 2019-08-25 NOTE — Telephone Encounter (Signed)
Pt daughter heather called back in regards to missed call

## 2019-08-25 NOTE — Telephone Encounter (Signed)
I received patient's brain MRI report from [facility] in [place], [place], patient had a brain MRI without contrast on 08/14/2019: Impression: No restricted diffusion to suggest acute infarct. Severe white matter disease statistically suggestive of small vessel changes. 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  Please advise patient's daughter that patient's brain MRI did not show any acute causes for her double vision and her abnormal eye movements. She does have a lot of vascular changes in the brain which were seen in 2017 when we did an MRI. Also, compared to 2016, the tiny strokes seen on this MRI seems new but does not appear to be acute. Again, no obvious explanation of her symptoms. Nevertheless, she does have prominent vascular changes including prior small strokes which were commented on in 2017 and changes with respect to hardening of the arteries were seen over the past several years. I do not see where she had any recent evaluation of her neck arteries and her heart pump function with an ultrasound, if she is agreeable, I will order a ultrasound of the heart and ultrasound of the neck arteries as part of work-up for stroke.

## 2019-08-26 ENCOUNTER — Telehealth: Payer: Self-pay | Admitting: Neurology

## 2019-08-26 NOTE — Telephone Encounter (Signed)
I received her carotid Doppler ultrasound report.  She had testing in [place] at [facility] vascular on 08/05/2019.  Conclusions: Normal right carotid arterial system.  The right vertebral artery demonstrates antegrade flow.  Minimal intimal thickening in the left internal carotid artery.  The left vertebral artery demonstrates antegrade flow.  She had an echocardiogram at the same location on 08/05/2019.  Conclusions: Normal biventricular systolic function.  Mild mitral and tricuspid regurgitation with mild pulmonary hypertension.  Overall results are reassuring.  Nothing further needed. Thank you for getting records sent.

## 2019-08-26 NOTE — Telephone Encounter (Signed)
There is indeed mentioning of a tiny stroke in the pons and it was not considered acute.  It is possible that it happened at the time of her eye symptoms but again, this is a finding that was felt to be chronic appearing.  In addition, she is on medications for secondary stroke prevention including Plavix.  She had work-up for stroke including evaluation of her heart and neck arteries.  Again, secondary stroke prevention entails blood pressure management, cholesterol management, weight management, staying well-hydrated, physical activity and continuing with Plavix.  Of note, her brain MRI from 2017 also mentioned old small strokes.

## 2019-08-26 NOTE — Telephone Encounter (Signed)
I reached out to the pt's daughter and advised of message. She states the pt's PCP called to review results of the MRI with her as well and they reported to the pt there was a tiny stroke located in pons in the brain but had no mention of the tiny strokes stated in our report. She also sts the pcp advised the stroke in the pons was new and was the cause of the eye disturbance.   Daughter is just trying to confirm on the report and what should be done next for her mom?

## 2019-08-26 NOTE — Telephone Encounter (Signed)
I reached out to the pt's daughter and advised of results. She verbalized understanding and had no further questions at this time.

## 2019-08-26 NOTE — Telephone Encounter (Signed)
That is good to know, thank you for requesting records.  Please advise patient to FU with [place] prn and FU with eye doctor and PCP as scheduled.

## 2019-08-26 NOTE — Telephone Encounter (Signed)
I contacted the pt's daughter and advised of results. She verbalized understanding She sts a few weeks ago the pt did have carotid US and echo with [facility]. Provider at the office is Dr. Luiz Ochoa.Daughter reports these studies were reported as normal. I have requested the requested the records to be sent to our office. [facility] contact information:  [phone] (p) [phone] (f)

## 2019-08-27 NOTE — Telephone Encounter (Signed)
Noted  

## 2020-01-28 ENCOUNTER — Other Ambulatory Visit: Payer: Medicare Other

## 2020-01-28 ENCOUNTER — Ambulatory Visit: Payer: Medicare Other | Admitting: Oncology

## 2020-07-24 DEATH — deceased
